# Patient Record
Sex: Male | Born: 1975
Health system: Southern US, Community
[De-identification: ages and names within clinical notes are randomized; demographics above are authoritative.]

## PROBLEM LIST (undated history)

## (undated) DIAGNOSIS — F32A Depression, unspecified: Secondary | ICD-10-CM

## (undated) DIAGNOSIS — F329 Major depressive disorder, single episode, unspecified: Secondary | ICD-10-CM

## (undated) DIAGNOSIS — B1920 Unspecified viral hepatitis C without hepatic coma: Secondary | ICD-10-CM

## (undated) DIAGNOSIS — D649 Anemia, unspecified: Secondary | ICD-10-CM

## (undated) DIAGNOSIS — F410 Panic disorder [episodic paroxysmal anxiety] without agoraphobia: Secondary | ICD-10-CM

## (undated) DIAGNOSIS — R7989 Other specified abnormal findings of blood chemistry: Secondary | ICD-10-CM

## (undated) DIAGNOSIS — F1910 Other psychoactive substance abuse, uncomplicated: Secondary | ICD-10-CM

## (undated) DIAGNOSIS — G709 Myoneural disorder, unspecified: Secondary | ICD-10-CM

## (undated) DIAGNOSIS — G039 Meningitis, unspecified: Secondary | ICD-10-CM

## (undated) DIAGNOSIS — K7460 Unspecified cirrhosis of liver: Secondary | ICD-10-CM

## (undated) DIAGNOSIS — F419 Anxiety disorder, unspecified: Secondary | ICD-10-CM

## (undated) HISTORY — DX: Other psychoactive substance abuse, uncomplicated: F19.10

## (undated) HISTORY — DX: Unspecified viral hepatitis C without hepatic coma: B19.20

## (undated) HISTORY — DX: Meningitis, unspecified: G03.9

## (undated) HISTORY — PX: ESOPHAGOGASTRODUODENOSCOPY: SHX1529

## (undated) HISTORY — DX: Depression, unspecified: F32.A

## (undated) HISTORY — DX: Unspecified cirrhosis of liver: K74.60

## (undated) HISTORY — DX: Anemia, unspecified: D64.9

## (undated) HISTORY — DX: Major depressive disorder, single episode, unspecified: F32.9

## (undated) HISTORY — DX: Myoneural disorder, unspecified: G70.9

## (undated) HISTORY — PX: WISDOM TOOTH EXTRACTION: SHX21

## (undated) HISTORY — DX: Other specified abnormal findings of blood chemistry: R79.89

## (undated) HISTORY — PX: COLONOSCOPY: SHX174

## (undated) HISTORY — PX: OTHER SURGICAL HISTORY: SHX169

---

## 2008-11-11 ENCOUNTER — Emergency Department (HOSPITAL_COMMUNITY): Admission: EM | Admit: 2008-11-11 | Discharge: 2008-11-11 | Payer: Self-pay | Admitting: Emergency Medicine

## 2009-05-03 ENCOUNTER — Emergency Department (HOSPITAL_COMMUNITY): Admission: EM | Admit: 2009-05-03 | Discharge: 2009-05-03 | Payer: Self-pay | Admitting: Emergency Medicine

## 2009-06-23 ENCOUNTER — Emergency Department (HOSPITAL_COMMUNITY): Admission: EM | Admit: 2009-06-23 | Discharge: 2009-06-23 | Payer: Self-pay | Admitting: Emergency Medicine

## 2011-03-11 LAB — URINALYSIS, ROUTINE W REFLEX MICROSCOPIC
Glucose, UA: NEGATIVE mg/dL
Hgb urine dipstick: NEGATIVE
Specific Gravity, Urine: 1.021 (ref 1.005–1.030)
pH: 7.5 (ref 5.0–8.0)

## 2015-03-05 ENCOUNTER — Encounter (HOSPITAL_COMMUNITY): Payer: Self-pay | Admitting: *Deleted

## 2015-03-05 ENCOUNTER — Emergency Department (INDEPENDENT_AMBULATORY_CARE_PROVIDER_SITE_OTHER)
Admission: EM | Admit: 2015-03-05 | Discharge: 2015-03-05 | Disposition: A | Discharge status: Home / Self Care | Payer: PRIVATE HEALTH INSURANCE | Source: Home / Self Care | Attending: Family Medicine | Admitting: Family Medicine

## 2015-03-05 DIAGNOSIS — F410 Panic disorder [episodic paroxysmal anxiety] without agoraphobia: Secondary | ICD-10-CM

## 2015-03-05 HISTORY — DX: Panic disorder (episodic paroxysmal anxiety): F41.0

## 2015-03-05 HISTORY — DX: Anxiety disorder, unspecified: F41.9

## 2015-03-05 MED ORDER — SERTRALINE HCL 50 MG PO TABS: 50.0000 mg | ORAL_TABLET | Freq: Every day | ORAL | Status: DC

## 2015-03-05 MED ORDER — CLONAZEPAM 0.5 MG PO TABS: 0.5000 mg | ORAL_TABLET | Freq: Two times a day (BID) | ORAL | Status: DC

## 2015-03-05 NOTE — ED Provider Notes (Signed)
Kenneth Brooks is a 39 y.o. male who presents to Urgent Care today for panic attacks. Patient has a history of panic disorder and has been doing reasonably well off of medicine. However recently multiple life stressors and happened. He has had multiple deaths in the family and is currently Getting divorced. He does not have a primary care provider in the area. He's done well with Klonopin in the past. He had a panic attack last week and yesterday. These lasted about 10 minutes and were controlled with breathing.   Past Medical History  Diagnosis Date  . Anxiety   . Panic attack    History reviewed. No pertinent past surgical history. History  Substance Use Topics  . Smoking status: Current Every Day Smoker    Types: Cigarettes  . Smokeless tobacco: Not on file  . Alcohol Use: No   ROS as above Medications: No current facility-administered medications for this encounter.   Current Outpatient Prescriptions  Medication Sig Dispense Refill  . aspirin 325 MG tablet Take 325 mg by mouth daily.    . clonazePAM (KLONOPIN) 0.5 MG tablet Take 1 tablet (0.5 mg total) by mouth 2 (two) times daily. 40 tablet 0  . sertraline (ZOLOFT) 50 MG tablet Take 1 tablet (50 mg total) by mouth daily. 30 tablet 0   Allergies  Allergen Reactions  . Bee Venom Shortness Of Breath     Exam:  BP 143/78 mmHg  Pulse 80  Temp(Src) 98.8 F (37.1 C) (Oral)  Resp 16  SpO2 96% Gen: Well NAD Psych: Alert and oriented normal affect and speech thought process. No SI or HI.  No results found for this or any previous visit (from the past 24 hour(s)). No results found.  Assessment and Plan: 39 y.o. male with panic disorder. Start sertraline. Limited Klonopin. Will not refill this medication here. Long list of primary care providers provided.  Discussed warning signs or symptoms. Please see discharge instructions. Patient expresses understanding.     Rodolph BongEvan S Cathie Bonnell, MD 03/05/15 1640

## 2015-03-05 NOTE — ED Notes (Signed)
Pt reports a hx of panic attacks the past 14 years.  He had one a week ago and yesterday   He has had  a lot of family issues to deal with lately.  At present, he feels some anxiety and is trying to do deep breathing to calm.

## 2015-03-05 NOTE — Discharge Instructions (Signed)
Thank you for coming in today. Follow up with primary doctor.   Panic Attacks Panic attacks are sudden, short-livedsurges of severe anxiety, fear, or discomfort. They may occur for no reason when you are relaxed, when you are anxious, or when you are sleeping. Panic attacks may occur for a number of reasons:   Healthy people occasionally have panic attacks in extreme, life-threatening situations, such as war or natural disasters. Normal anxiety is a protective mechanism of the body that helps [place] react to danger (fight or flight response).  Panic attacks are often seen with anxiety disorders, such as panic disorder, social anxiety disorder, generalized anxiety disorder, and phobias. Anxiety disorders cause excessive or uncontrollable anxiety. They may interfere with your relationships or other life activities.  Panic attacks are sometimes seen with other mental illnesses, such as depression and posttraumatic stress disorder.  Certain medical conditions, prescription medicines, and drugs of abuse can cause panic attacks. SYMPTOMS  Panic attacks start suddenly, peak within 20 minutes, and are accompanied by four or more of the following symptoms:  Pounding heart or fast heart rate (palpitations).  Sweating.  Trembling or shaking.  Shortness of breath or feeling smothered.  Feeling choked.  Chest pain or discomfort.  Nausea or strange feeling in your stomach.  Dizziness, light-headedness, or feeling like you will faint.  Chills or hot flushes.  Numbness or tingling in your lips or hands and feet.  Feeling that things are not real or feeling that you are not yourself.  Fear of losing control or going crazy.  Fear of dying. Some of these symptoms can mimic serious medical conditions. For example, you may think you are having a heart attack. Although panic attacks can be very scary, they are not life threatening. DIAGNOSIS  Panic attacks are diagnosed through an assessment by  your health care provider. Your health care provider will ask questions about your symptoms, such as where and when they occurred. Your health care provider will also ask about your medical history and use of alcohol and drugs, including prescription medicines. Your health care provider may order blood tests or other studies to rule out a serious medical condition. Your health care provider may refer you to a mental health professional for further evaluation. TREATMENT   Most healthy people who have one or two panic attacks in an extreme, life-threatening situation will not require treatment.  The treatment for panic attacks associated with anxiety disorders or other mental illness typically involves counseling with a mental health professional, medicine, or a combination of both. Your health care provider will help determine what treatment is best for you.  Panic attacks due to physical illness usually go away with treatment of the illness. If prescription medicine is causing panic attacks, talk with your health care provider about stopping the medicine, decreasing the dose, or substituting another medicine.  Panic attacks due to alcohol or drug abuse go away with abstinence. Some adults need professional help in order to stop drinking or using drugs. HOME CARE INSTRUCTIONS   Take all medicines as directed by your health care provider.   Schedule and attend follow-up visits as directed by your health care provider. It is important to keep all your appointments. SEEK MEDICAL CARE IF:  You are not able to take your medicines as prescribed.  Your symptoms do not improve or get worse. SEEK IMMEDIATE MEDICAL CARE IF:   You experience panic attack symptoms that are different than your usual symptoms.  You have serious thoughts about  hurting yourself or others.  You are taking medicine for panic attacks and have a serious side effect. MAKE SURE YOU:  Understand these instructions.  Will  watch your condition.  Will get help right away if you are not doing well or get worse. Document Released: 11/19/2005 Document Revised: 11/24/2013 Document Reviewed: 07/03/2013 [facility] Patient Information 2015 [place], [place]. This information is not intended to replace advice given to you by your health care provider. Make sure you discuss any questions you have with your health care provider.  PRIMARY CARE Merchant navy officerDOCTORS [facility] [address]  [place], [place] [phone]  Fax [phone]  Nature conservation officerLeBauer Health[facility] at [facility] [address]  [place], [place] [phone]  Fax [phone]  Nature conservation officer[facility] at [place] [facility] [place] [phone]  [place], [place] [phone]  Fax [phone]  [facility] [address]  [place], [place] [phone]  Fax [phone]  [facility] [address]  [place], [place] [phone]  Fax [phone]  [facility] [address] [place]  [place], [place] [phone]  Fax [phone]   [facility] [place] [address] [place] [address] Phone: [phone]   [facility] [address] [place] [address] Phone: [phone]   [facility] @ [place] [address] [place] [address] Phone: [phone]   [facility] @ [place] [address] [place] [address] Phone: [phone]   [facility] @ [place] [address] [place] [address] Phone: [phone] Fax: [phone]   [facility] @ [place] [address] [place] [address] Phone: [phone]   Dr. Maryelizabeth RowanElizabeth Dewey [address] [place]  [id number] [phone]

## 2015-05-03 ENCOUNTER — Encounter (HOSPITAL_COMMUNITY): Payer: Self-pay | Admitting: Family Medicine

## 2015-05-03 ENCOUNTER — Emergency Department (INDEPENDENT_AMBULATORY_CARE_PROVIDER_SITE_OTHER)
Admission: EM | Admit: 2015-05-03 | Discharge: 2015-05-03 | Disposition: A | Discharge status: Home / Self Care | Payer: PRIVATE HEALTH INSURANCE | Source: Home / Self Care | Attending: Family Medicine | Admitting: Family Medicine

## 2015-05-03 DIAGNOSIS — F419 Anxiety disorder, unspecified: Secondary | ICD-10-CM | POA: Diagnosis not present

## 2015-05-03 DIAGNOSIS — Z720 Tobacco use: Secondary | ICD-10-CM

## 2015-05-03 MED ORDER — SERTRALINE HCL 50 MG PO TABS: 50.0000 mg | ORAL_TABLET | Freq: Every day | ORAL | Status: DC

## 2015-05-03 MED ORDER — CLONAZEPAM 1 MG PO TABS: 1.0000 mg | ORAL_TABLET | Freq: Two times a day (BID) | ORAL | Status: DC

## 2015-05-03 NOTE — ED Notes (Signed)
See provider's note

## 2015-05-03 NOTE — ED Provider Notes (Signed)
CSN: [id number]     Arrival date & time 05/03/15  1627 History   First MD Initiated Contact with Patient 05/03/15 1730     No chief complaint on file.  (Consider location/radiation/quality/duration/timing/severity/associated sxs/prior Treatment) HPI  Feeling Anxious. Ran out of Klonopin and Zoloft today. Travels around the country for work and does not have a PCP. In [place] for 5 days. Denies on palpitations, shortness of breath, to nausea, vomiting, headache, fever, abdominal pain. Symptoms are intermittent and getting worse.    Past Medical History  Diagnosis Date  . Anxiety   . Panic attack    History reviewed. No pertinent past surgical history. Family History  Problem Relation Age of Onset  . Family history unknown: Yes   History  Substance Use Topics  . Smoking status: Current Every Day Smoker -- 0.50 packs/day    Types: Cigarettes  . Smokeless tobacco: Not on file  . Alcohol Use: No    Review of Systems Per HPI with all other pertinent systems negative.   Allergies  Bee venom  Home Medications   Prior to Admission medications   Medication Sig Start Date End Date Taking? Authorizing Provider  aspirin 325 MG tablet Take 325 mg by mouth daily.    Historical Provider, MD  clonazePAM (KLONOPIN) 1 MG tablet Take 1 tablet (1 mg total) by mouth 2 (two) times daily. 05/03/15   Ozella Rocksavid J Merrell, MD  sertraline (ZOLOFT) 50 MG tablet Take 1 tablet (50 mg total) by mouth daily. 05/03/15   Ozella Rocksavid J Merrell, MD   BP 127/76 mmHg  Pulse 93  Temp(Src) 99.4 F (37.4 C) (Oral)  Resp 18  SpO2 100% Physical Exam Physical Exam  Constitutional: oriented to person, place, and time. appears well-developed and well-nourished. No distress.  HENT:  Head: Normocephalic and atraumatic.  Eyes: EOMI. PERRL.  Neck: Normal range of motion.  Cardiovascular: RRR, no m/r/g, 2+ distal pulses,  Pulmonary/Chest: Effort normal and breath sounds normal. No respiratory distress.  Abdominal: Soft. Bowel  sounds are normal. NonTTP, no distension.  Musculoskeletal: Normal range of motion. Non ttp, no effusion.  Neurological: alert and oriented to person, place, and time.  Skin: Skin is warm. No rash noted. non diaphoretic.  Psychiatric: normal mood and affect. behavior is normal. Judgment and thought content normal.   ED Course  Procedures (including critical care time) Labs Review Labs Reviewed - No data to display  Imaging Review No results found.   MDM   1. Anxiety   2. Tobacco use    Call patient's pharmacy confirmed the patient received 60 tablets of Klonopin on 04/12/2015. Refill of 1mg  Klonopin given dated 05/13/15. Pt aware that he cannot get this filled until that time. Pt aware and will try some of his relaxation techniques until that time or will seek further care throught the Ed. Informed that no further refills will be given through the UC. Pt to seek a PCP for further mgt. Zoloft refilled  tobacco cessation counseling given.   Ozella Rocksavid J Merrell, MD 05/03/15 [phone]

## 2015-05-03 NOTE — Discharge Instructions (Signed)
You are using your Klonopin too rapidly. This is dangerous. Please establish care at a local clinic for further refills. We cannot fill your Klonopin until 05/13/15, and will not be able to give you further refills on this medicine. Please continue your zoloft.

## 2016-02-10 ENCOUNTER — Encounter (HOSPITAL_COMMUNITY): Payer: Self-pay | Admitting: Vascular Surgery

## 2016-02-10 ENCOUNTER — Emergency Department (HOSPITAL_COMMUNITY)
Admission: EM | Admit: 2016-02-10 | Discharge: 2016-02-10 | Disposition: A | Discharge status: Home / Self Care | Payer: PRIVATE HEALTH INSURANCE | Attending: Emergency Medicine | Admitting: Emergency Medicine

## 2016-02-10 DIAGNOSIS — Z7982 Long term (current) use of aspirin: Secondary | ICD-10-CM | POA: Insufficient documentation

## 2016-02-10 DIAGNOSIS — M5442 Lumbago with sciatica, left side: Secondary | ICD-10-CM | POA: Insufficient documentation

## 2016-02-10 DIAGNOSIS — F17210 Nicotine dependence, cigarettes, uncomplicated: Secondary | ICD-10-CM | POA: Insufficient documentation

## 2016-02-10 DIAGNOSIS — F419 Anxiety disorder, unspecified: Secondary | ICD-10-CM | POA: Insufficient documentation

## 2016-02-10 DIAGNOSIS — Z79899 Other long term (current) drug therapy: Secondary | ICD-10-CM | POA: Insufficient documentation

## 2016-02-10 MED ORDER — IBUPROFEN 400 MG PO TABS
800.0000 mg | ORAL_TABLET | Freq: Once | ORAL | Status: AC
  Administered 2016-02-10: 800 mg via ORAL
  Filled 2016-02-10: qty 2

## 2016-02-10 MED ORDER — METHOCARBAMOL 500 MG PO TABS: 500.0000 mg | ORAL_TABLET | Freq: Two times a day (BID) | ORAL | Status: DC

## 2016-02-10 MED ORDER — METHOCARBAMOL 500 MG PO TABS
1000.0000 mg | ORAL_TABLET | Freq: Once | ORAL | Status: AC
  Administered 2016-02-10: 1000 mg via ORAL
  Filled 2016-02-10: qty 2

## 2016-02-10 MED ORDER — IBUPROFEN 600 MG PO TABS: 600.0000 mg | ORAL_TABLET | Freq: Four times a day (QID) | ORAL | Status: DC | PRN

## 2016-02-10 NOTE — Discharge Instructions (Signed)
[name],  Nice meeting you! Please follow-up with your primary care provider within two weeks. Return to the emergency department if you develop fevers, chills, weight loss, loss of bladder/bowel control. Feel better soon!  S. Lane HackerNicole Marqual Mi, PA-C Chronic Back Pain  When back pain lasts longer than 3 months, it is called chronic back pain.People with chronic back pain often go through certain periods that are more intense (flare-ups).  CAUSES Chronic back pain can be caused by wear and tear (degeneration) on different structures in your back. These structures include:  The bones of your spine (vertebrae) and the joints surrounding your spinal cord and nerve roots (facets).  The strong, fibrous tissues that connect your vertebrae (ligaments). Degeneration of these structures may result in pressure on your nerves. This can lead to constant pain. HOME CARE INSTRUCTIONS  Avoid bending, heavy lifting, prolonged sitting, and activities which make the problem worse.  Take brief periods of rest throughout the day to reduce your pain. Lying down or standing usually is better than sitting while you are resting.  Take over-the-counter or prescription medicines only as directed by your caregiver. SEEK IMMEDIATE MEDICAL CARE IF:   You have weakness or numbness in one of your legs or feet.  You have trouble controlling your bladder or bowels.  You have nausea, vomiting, abdominal pain, shortness of breath, or fainting.   This information is not intended to replace advice given to you by your health care provider. Make sure you discuss any questions you have with your health care provider.   Document Released: 12/27/2004 Document Revised: 02/11/2012 Document Reviewed: 05/09/2015 Elsevier Interactive Patient Education [employer]2016 Elsevier Inc.

## 2016-02-10 NOTE — ED Notes (Signed)
Pt is here mainly for pain in low back, left side.  Pain goes down his buttock.  Denies injury.   Also wants US on what a friend of his told him was probably a cystic lymph node in his neck.  Is concerned he has sinus infection.

## 2016-02-10 NOTE — ED Provider Notes (Signed)
CSN: [id number]     Arrival date & time 02/10/16  1340 History  By signing my name below, I, Placido Sou, attest that this documentation has been prepared under the direction and in the presence of Melton Krebs PA-C. Electronically Signed: Placido Sou, ED Scribe. 02/10/2016. 6:08 PM.   Chief Complaint  Patient presents with  . Back Pain   The history is provided by the patient. No language interpreter was used.   HPI Comments: Kenneth Brooks is a 40 y.o. male who presents to the Emergency Department complaining of intermittent, moderate, low-central back pain onset 1 year ago and worsening a few days ago. Pt reports injuring his back at work 1 year ago which alleviated after 3-4 weeks noting that his pain presented once again and is similar to his past episode. He reports a radiation of pain downwards through his left gluteal region and worsening pain with movement. He also complains of a "cystic lymph node" to his right anterior throat onset in the past few weeks. Pt denies any throat pain, Trouble breathing, drooling, dysphagia, incontinence of his bowels or bladder or other associated symptoms at this time.   Past Medical History  Diagnosis Date  . Anxiety   . Panic attack    History reviewed. No pertinent past surgical history. Family History  Problem Relation Age of Onset  . Family history unknown: Yes   Social History  Substance Use Topics  . Smoking status: Current Every Day Smoker -- 0.50 packs/day    Types: Cigarettes  . Smokeless tobacco: None  . Alcohol Use: No    Review of Systems A complete 10 system review of systems was obtained and all systems are negative except as noted in the HPI and PMH.   Allergies  Bee venom  Home Medications   Prior to Admission medications   Medication Sig Start Date End Date Taking? Authorizing Provider  aspirin 325 MG tablet Take 325 mg by mouth daily.    Historical Provider, MD  clonazePAM (KLONOPIN) 1 MG tablet  Take 1 tablet (1 mg total) by mouth 2 (two) times daily. 05/03/15   Ozella Rocks, MD  sertraline (ZOLOFT) 50 MG tablet Take 1 tablet (50 mg total) by mouth daily. 05/03/15   Ozella Rocks, MD   BP 131/80 mmHg  Pulse 71  Temp(Src) 98.6 F (37 C) (Oral)  Resp 18  Wt 215 lb (97.523 kg)  SpO2 97%    Physical Exam  Constitutional: He is oriented to person, place, and time. He appears well-developed and well-nourished.  HENT:  Head: Normocephalic and atraumatic.  Mouth/Throat: Oropharynx is clear and moist. No oropharyngeal exudate.  Airway intact.  Eyes: EOM are normal.  Neck: Normal range of motion. Neck supple.  No masses felt around neck.   Cardiovascular: Normal rate.   Pulmonary/Chest: Effort normal. No stridor. No respiratory distress.  Abdominal: Soft.  Musculoskeletal: Normal range of motion. He exhibits tenderness.  No CTL midline spinal tenderness. Tenderness over left SI joint.   Lymphadenopathy:    He has no cervical adenopathy.  Neurological: He is alert and oriented to person, place, and time.  Skin: Skin is warm and dry.  Psychiatric: He has a normal mood and affect.  Nursing note and vitals reviewed.   ED Course  Procedures  DIAGNOSTIC STUDIES: Oxygen Saturation is 97% on RA, normal by my interpretation.    COORDINATION OF CARE: 6:04 PM Discussed next steps with pt including robaxin and motrin. He verbalized understanding  and is agreeable with the plan.    MDM   Final diagnoses:  Low back pain with left-sided sciatica, unspecified back pain laterality   Patient with back pain.  No neurological deficits and normal neuro exam.  Patient can walk but states is painful.  No loss of bowel or bladder control.  No concern for cauda equina.  No fever, night sweats, weight loss, h/o cancer, IVDU.  RICE protocol and pain medicine indicated and discussed with patient.  Patient sent home with Robaxin and ibuprofen, as well as encouragement to follow-up with primary  care provider for his "cystic lymph node" as well as his chronic back pain. Patient is in understanding and agreement with the plan. Patient may be safely discharged at this time.  Melton KrebsSamantha Nicole Kaizer Dissinger, PA-C 02/10/16 2340  Glynn OctaveStephen Rancour, MD 02/10/16 [phone]

## 2016-02-10 NOTE — ED Notes (Signed)
Pt reports to the ED for eval of lower back pain. Got injured last year at work. Pt denies any numbness, tingling, paralysis, or bowel or bladder incontinence. Pt also reports possible sinus infection and sore throat. Pt requesting u/s for throat because he feels like something is stuck in it. Pt A&Ox4, resp e/u, and skin warm and dry.

## 2016-08-08 ENCOUNTER — Emergency Department (HOSPITAL_COMMUNITY)
Admission: EM | Admit: 2016-08-08 | Discharge: 2016-08-08 | Disposition: A | Discharge status: Home / Self Care | Payer: PRIVATE HEALTH INSURANCE | Attending: Physician Assistant | Admitting: Physician Assistant

## 2016-08-08 ENCOUNTER — Encounter (HOSPITAL_COMMUNITY): Payer: Self-pay | Admitting: Emergency Medicine

## 2016-08-08 DIAGNOSIS — K0889 Other specified disorders of teeth and supporting structures: Secondary | ICD-10-CM

## 2016-08-08 DIAGNOSIS — Z79899 Other long term (current) drug therapy: Secondary | ICD-10-CM | POA: Insufficient documentation

## 2016-08-08 DIAGNOSIS — F17210 Nicotine dependence, cigarettes, uncomplicated: Secondary | ICD-10-CM | POA: Insufficient documentation

## 2016-08-08 DIAGNOSIS — Z7982 Long term (current) use of aspirin: Secondary | ICD-10-CM | POA: Insufficient documentation

## 2016-08-08 MED ORDER — PENICILLIN V POTASSIUM 250 MG PO TABS: 250.0000 mg | ORAL_TABLET | Freq: Four times a day (QID) | ORAL | 0 refills | Status: AC

## 2016-08-08 NOTE — ED Triage Notes (Signed)
Pt c/o pain in back right tooth, states his insurance hasns't kicked it, requesting antibiotics to take before he sees a Education officer, communitydentist.

## 2016-08-08 NOTE — ED Provider Notes (Signed)
[facility]EMERGENCY DEPT Provider Note   CSN: [id number] Arrival date & time: 08/08/16  1357    By signing my name below, I, Sonum Patel, attest that this documentation has been prepared under the direction and in the presence of Teressa LowerVrinda Correne Lalani, NP. Electronically Signed: Sonum Patel, Neurosurgeoncribe. 08/08/16. 2:20 PM.  History   Chief Complaint Chief Complaint  Patient presents with  . Dental Pain    The history is provided by the patient. No language interpreter was used.     HPI Comments: Kenneth Brooks is a 40 y.o. male who presents to the Emergency Department complaining of constant, gradually worsened right lower dental pain that has worsened over the last several days. He denies dental follow up; states he is waiting for his insurance to become active. He fracture the tooth a long time ago. Denies problems swallowing or breathing.   Past Medical History:  Diagnosis Date  . Anxiety   . Panic attack     There are no active problems to display for this patient.   History reviewed. No pertinent surgical history.     Home Medications    Prior to Admission medications   Medication Sig Start Date End Date Taking? Authorizing Provider  aspirin 325 MG tablet Take 325 mg by mouth daily.    Historical Provider, MD  clonazePAM (KLONOPIN) 1 MG tablet Take 1 tablet (1 mg total) by mouth 2 (two) times daily. 05/03/15   Ozella Rocksavid J Merrell, MD  ibuprofen (ADVIL,MOTRIN) 600 MG tablet Take 1 tablet (600 mg total) by mouth every 6 (six) hours as needed. 02/10/16   Melton KrebsSamantha Nicole Riley, PA-C  methocarbamol (ROBAXIN) 500 MG tablet Take 1 tablet (500 mg total) by mouth 2 (two) times daily. 02/10/16   Melton KrebsSamantha Nicole Riley, PA-C  sertraline (ZOLOFT) 50 MG tablet Take 1 tablet (50 mg total) by mouth daily. 05/03/15   Ozella Rocksavid J Merrell, MD    Family History Family History  Problem Relation Age of Onset  . Family history unknown: Yes    Social History Social History  Substance Use Topics  . Smoking  status: Current Every Day Smoker    Packs/day: 0.50    Types: Cigarettes  . Smokeless tobacco: Not on file  . Alcohol use No     Allergies   Bee venom   Review of Systems Review of Systems  Constitutional: Negative for fever.  HENT: Positive for dental problem.   All other systems reviewed and are negative.    Physical Exam Updated Vital Signs BP 150/91   Pulse 90   Temp 97.5 F (36.4 C) (Oral)   Resp 18   SpO2 98%   Physical Exam  Constitutional: He is oriented to person, place, and time. He appears well-developed and well-nourished. No distress.  HENT:  Head: Normocephalic and atraumatic.  Right Ear: External ear normal.  Left Ear: External ear normal.  Decayed cracked tooth to the right lower dentition. No facial swelling noted  Eyes: Conjunctivae and EOM are normal.  Neck: Neck supple. No tracheal deviation present.  Cardiovascular: Normal rate.   Pulmonary/Chest: Effort normal. No respiratory distress.  Musculoskeletal: Normal range of motion.  Neurological: He is alert and oriented to person, place, and time.  Skin: Skin is warm and dry.  Psychiatric: He has a normal mood and affect. His behavior is normal.  Nursing note and vitals reviewed.    ED Treatments / Results  DIAGNOSTIC STUDIES: Oxygen Saturation is 98% on RA, normal by my interpretation.  COORDINATION OF CARE: 2:24 PM Discussed treatment plan with pt at bedside and pt agreed to plan.   Labs (all labs ordered are listed, but only abnormal results are displayed) Labs Reviewed - No data to display  EKG  EKG Interpretation None       Radiology No results found.  Procedures Procedures (including critical care time)  Medications Ordered in ED Medications - No data to display   Initial Impression / Assessment and Plan / ED Course  I have reviewed the triage vital signs and the nursing notes.  Pertinent labs & imaging results that were available during my care of the patient  were reviewed by me and considered in my medical decision making (see chart for details).  Clinical Course   Will treat with pcn. Discussed dental follow up. No sign of ludwigs angina  Final Clinical Impressions(s) / ED Diagnoses   Final diagnoses:  None    New Prescriptions New Prescriptions   No medications on file   I personally performed the services described in this documentation, which was scribed in my presence. The recorded information has been reviewed and is accurate.    Teressa Lower, NP 08/08/16 1436    Courteney Randall An, MD 08/10/16 1101

## 2017-05-31 ENCOUNTER — Ambulatory Visit (HOSPITAL_COMMUNITY)
Admission: EM | Admit: 2017-05-31 | Discharge: 2017-05-31 | Disposition: A | Discharge status: Home / Self Care | Payer: BLUE CROSS/BLUE SHIELD | Attending: Internal Medicine | Admitting: Internal Medicine

## 2017-05-31 ENCOUNTER — Encounter (HOSPITAL_COMMUNITY): Payer: Self-pay | Admitting: Emergency Medicine

## 2017-05-31 DIAGNOSIS — B369 Superficial mycosis, unspecified: Secondary | ICD-10-CM

## 2017-05-31 DIAGNOSIS — M5432 Sciatica, left side: Secondary | ICD-10-CM

## 2017-05-31 DIAGNOSIS — L03114 Cellulitis of left upper limb: Secondary | ICD-10-CM | POA: Diagnosis not present

## 2017-05-31 MED ORDER — SULFAMETHOXAZOLE-TRIMETHOPRIM 800-160 MG PO TABS: 1.0000 | ORAL_TABLET | Freq: Two times a day (BID) | ORAL | 0 refills | Status: AC

## 2017-05-31 MED ORDER — FLUCONAZOLE 200 MG PO TABS: 200.0000 mg | ORAL_TABLET | Freq: Every day | ORAL | 0 refills | Status: DC

## 2017-05-31 MED ORDER — GABAPENTIN 300 MG PO CAPS: 300.0000 mg | ORAL_CAPSULE | Freq: Three times a day (TID) | ORAL | 0 refills | Status: DC

## 2017-05-31 MED ORDER — FLUCONAZOLE 200 MG PO TABS: 200.0000 mg | ORAL_TABLET | Freq: Every day | ORAL | 0 refills | Status: AC

## 2017-05-31 MED ORDER — SULFAMETHOXAZOLE-TRIMETHOPRIM 800-160 MG PO TABS: 1.0000 | ORAL_TABLET | Freq: Two times a day (BID) | ORAL | 0 refills | Status: DC

## 2017-05-31 NOTE — Discharge Instructions (Signed)
For your cellulitis (skin infection) take bactrim, 1 tablet twice a day for 7 days. Apply warm compress 4 or more times a day. If abscess forms or expands, return to clinic for drainage.  For rash, most likely fungal, I have started you on diflucan, take 1 tablet daily for 1 week. If it persists past two weeks, return to clinic as needed.  Finally, for sciatica, I have refilled you gabapentin, take 1 tablet three times a day, take three days to get to the full dose, ( 1 tablet at bed time tonight, 1 tablet twice a day tomorrow, on the third day 1 tablet 3 times a day) Follow up with primary care for further management of this condition.

## 2017-05-31 NOTE — ED Triage Notes (Signed)
Pt here for abscess on left arm onset 1.5 week  Sx include swelling, redness  Denies fevers, chills  Also c/o rash on legs and feet that come and go.   A&O x4... NAD... Ambulatory

## 2017-05-31 NOTE — ED Provider Notes (Signed)
CSN: [id number]     Arrival date & time 05/31/17  1432 History   First MD Initiated Contact with Patient 05/31/17 1456     Chief Complaint  Patient presents with  . Abscess   (Consider location/radiation/quality/duration/timing/severity/associated sxs/prior Treatment) Elijah BirkJames W Harkins is a 41 y.o. male with a past history of anxiety, who [facility] with a chief complaint of rash on his left forearm, a rash on his right foot, and lower back pain.  With regard to his lower back pain, he has a past diagnosis of sciatica, states he has been treated before with gabapentin successfully, he has no new acute changes in his pain. States he does not currently have primary care, however he recently got insurance, and is looking to establish with one. Has no loss of control of bowel or bladder function, no fever, chills, or other concerning symptoms for back pain.  With regard to the rash and the forearm, states he had a scrape to his skin last week, his progressively gotten redder, more painful, not draining pus, no red streaking up the arms.  With regard to the rash on his foot, is worse in the heat, when he is sweating, he has had similar rashes in the past, he is not painful, but states it does burn.       Past Medical History:  Diagnosis Date  . Anxiety   . Panic attack    History reviewed. No pertinent surgical history. Family History  Problem Relation Age of Onset  . Family history unknown: Yes   Social History  Substance Use Topics  . Smoking status: Current Every Day Smoker    Packs/day: 0.50    Types: Cigarettes  . Smokeless tobacco: Never Used  . Alcohol use No    Review of Systems  Constitutional: Negative.   HENT: Negative.   Cardiovascular: Negative.   Gastrointestinal: Negative.   Musculoskeletal: Positive for back pain.  Skin: Positive for rash.  Neurological: Negative.     Allergies  Bee venom  Home Medications   Prior to  Admission medications   Medication Sig Start Date End Date Taking? Authorizing Provider  lactobacillus acidophilus (BACID) TABS tablet Take 2 tablets by mouth 3 (three) times daily.   Yes [provider]  METHADONE HCL PO Take by mouth.   Yes [provider]  aspirin 325 MG tablet Take 325 mg by mouth daily.    [provider]  clonazePAM (KLONOPIN) 1 MG tablet Take 1 tablet (1 mg total) by mouth 2 (two) times daily. 05/03/15   Ozella RocksMerrell, David J, MD  fluconazole (DIFLUCAN) 200 MG tablet Take 1 tablet (200 mg total) by mouth daily. 05/31/17 06/07/17  Dorena BodoKennard, Tayna Smethurst, NP  gabapentin (NEURONTIN) 300 MG capsule Take 1 capsule (300 mg total) by mouth 3 (three) times daily. 05/31/17   Dorena BodoKennard, Kasee Hantz, NP  ibuprofen (ADVIL,MOTRIN) 600 MG tablet Take 1 tablet (600 mg total) by mouth every 6 (six) hours as needed. 02/10/16   Melton Krebsiley, Samantha Nicole, PA-C  methocarbamol (ROBAXIN) 500 MG tablet Take 1 tablet (500 mg total) by mouth 2 (two) times daily. 02/10/16   Melton Krebsiley, Samantha Nicole, PA-C  sertraline (ZOLOFT) 50 MG tablet Take 1 tablet (50 mg total) by mouth daily. 05/03/15   Ozella RocksMerrell, David J, MD  sulfamethoxazole-trimethoprim (BACTRIM DS,SEPTRA DS) 800-160 MG tablet Take 1 tablet by mouth 2 (two) times daily. 05/31/17 06/07/17  Dorena BodoKennard, Lashunta Frieden, NP   Meds Ordered and Administered this Visit  Medications - No data to display  BP (!) 142/80 (BP Location: Left Arm)   Pulse 85   Temp 98.1 F (36.7 C) (Oral)   Resp 16   SpO2 95%  No data found.   Physical Exam  Constitutional: He is oriented to person, place, and time. He appears well-developed and well-nourished. No distress.  HENT:  Head: Normocephalic.  Right Ear: External ear normal.  Left Ear: External ear normal.  Mouth/Throat: Oropharynx is clear and moist.  Eyes: Conjunctivae are normal.  Neck: Normal range of motion.  Cardiovascular: Normal rate and regular rhythm.   Pulmonary/Chest: Effort normal and breath sounds  normal.  Musculoskeletal: He exhibits tenderness.  Neurological: He is alert and oriented to person, place, and time.  Skin: Skin is warm. Capillary refill takes less than 2 seconds. Rash noted. He is not diaphoretic.  Proximately 2 x 2 centimeter area of redness and warmth, and swelling in the left forearm, consistent with cellulitis, does not appear to have consolidated into a firm abscess.  Dry, scaling, erythemic lesions to the right foot.  Psychiatric: He has a normal mood and affect. His behavior is normal.  Nursing note and vitals reviewed.   Urgent Care Course     Procedures (including critical care time)  Labs Review Labs Reviewed - No data to display  Imaging Review No results found.    MDM   1. Cellulitis of left upper extremity   2. Fungal skin infection   3. Sciatica of left side    Present diagnosis sciatica, patient given refill on gabapentin, and referral to community health and wellness to establish for primary care.  Cellulitis, started on Bactrim twice daily, encouraged warm compresses, return to clinic as needed.  With regard to the skin rash on the foot, most likely fungal, given short course of Diflucan, and advised to return to clinic in 2 weeks if persists     Dorena Bodo, NP 05/31/17 1605

## 2017-06-28 ENCOUNTER — Ambulatory Visit (INDEPENDENT_AMBULATORY_CARE_PROVIDER_SITE_OTHER): Payer: BLUE CROSS/BLUE SHIELD

## 2017-06-28 ENCOUNTER — Ambulatory Visit (INDEPENDENT_AMBULATORY_CARE_PROVIDER_SITE_OTHER): Payer: BLUE CROSS/BLUE SHIELD | Admitting: Physician Assistant

## 2017-06-28 ENCOUNTER — Encounter: Payer: Self-pay | Admitting: Physician Assistant

## 2017-06-28 VITALS — BP 137/88 | HR 77 | Temp 98.3°F | Resp 16 | Ht 72.0 in | Wt 263.0 lb

## 2017-06-28 DIAGNOSIS — G8929 Other chronic pain: Secondary | ICD-10-CM | POA: Diagnosis not present

## 2017-06-28 DIAGNOSIS — R0600 Dyspnea, unspecified: Secondary | ICD-10-CM

## 2017-06-28 DIAGNOSIS — R05 Cough: Secondary | ICD-10-CM | POA: Diagnosis not present

## 2017-06-28 DIAGNOSIS — R233 Spontaneous ecchymoses: Secondary | ICD-10-CM

## 2017-06-28 DIAGNOSIS — F17210 Nicotine dependence, cigarettes, uncomplicated: Secondary | ICD-10-CM

## 2017-06-28 DIAGNOSIS — Z131 Encounter for screening for diabetes mellitus: Secondary | ICD-10-CM | POA: Diagnosis not present

## 2017-06-28 DIAGNOSIS — Z1159 Encounter for screening for other viral diseases: Secondary | ICD-10-CM

## 2017-06-28 DIAGNOSIS — M545 Low back pain: Secondary | ICD-10-CM | POA: Diagnosis not present

## 2017-06-28 DIAGNOSIS — M5442 Lumbago with sciatica, left side: Secondary | ICD-10-CM | POA: Diagnosis not present

## 2017-06-28 DIAGNOSIS — R0609 Other forms of dyspnea: Secondary | ICD-10-CM | POA: Diagnosis not present

## 2017-06-28 LAB — POCT URINALYSIS DIP (MANUAL ENTRY)
Glucose, UA: NEGATIVE mg/dL
Ketones, POC UA: NEGATIVE mg/dL
Leukocytes, UA: NEGATIVE
NITRITE UA: NEGATIVE
PH UA: 5.5 (ref 5.0–8.0)
RBC UA: NEGATIVE
Spec Grav, UA: >=1.03 — AB (ref 1.010–1.025)
UROBILINOGEN UA: 1 U/dL

## 2017-06-28 LAB — POCT CBC
Granulocyte percent: 48.1 %G (ref 37–80)
HEMATOCRIT: 46.2 % (ref 43.5–53.7)
Hemoglobin: 15.4 g/dL (ref 14.1–18.1)
Lymph, poc: 3.5 — AB (ref 0.6–3.4)
MCH, POC: 31.1 pg (ref 27–31.2)
MCHC: 33.2 g/dL (ref 31.8–35.4)
MCV: 93.5 fL (ref 80–97)
MID (cbc): 0.6 (ref 0–0.9)
MPV: 8.3 fL (ref 0–99.8)
POC GRANULOCYTE: 3.8 (ref 2–6.9)
POC LYMPH %: 43.9 % (ref 10–50)
POC MID %: 8 %M (ref 0–12)
Platelet Count, POC: 168 10*3/uL (ref 142–424)
RBC: 4.94 M/uL (ref 4.69–6.13)
RDW, POC: 13.8 %
WBC: 8 10*3/uL (ref 4.6–10.2)

## 2017-06-28 MED ORDER — GABAPENTIN 300 MG PO CAPS: 300.0000 mg | ORAL_CAPSULE | Freq: Three times a day (TID) | ORAL | 3 refills | Status: DC

## 2017-06-28 MED ORDER — VARENICLINE TARTRATE 1 MG PO TABS: 1.0000 mg | ORAL_TABLET | Freq: Two times a day (BID) | ORAL | 2 refills | Status: DC

## 2017-06-28 MED ORDER — VARENICLINE TARTRATE 0.5 MG X 11 & 1 MG X 42 PO MISC: ORAL | 0 refills | Status: DC

## 2017-06-28 NOTE — Patient Instructions (Addendum)
See you in three months.   We will call about referral to Hep C clinic.   Work on the smoking  Will call you about using Ibuprofen again.        IF you received an x-ray today, you will receive an invoice from [facility]. Please contact [facility] at [phone] with questions or concerns regarding your invoice.   IF you received labwork today, you will receive an invoice from [facility]. Please contact [facility] at [phone] with questions or concerns regarding your invoice.   Our billing staff will not be able to assist you with questions regarding bills from these companies.  You will be contacted with the lab results as soon as they are available. The fastest way to get your results is to activate your My Chart account. Instructions are located on the last page of this paperwork. If you have not heard from us regarding the results in 2 weeks, please contact this office.

## 2017-06-28 NOTE — Progress Notes (Signed)
07/01/2017 11:02 AM   DOB: 1976/08/08 / MRN: [id number]  SUBJECTIVE:  Kenneth Brooks is a 41 y.o. male presenting for he is a former drug user and has a known history of Hep C.  Takes methadone daily.  Tells me he has been clean now for 1.5 years. He is working 50 hours weekly. Tells me he would like this treated. Has been screened for HIV and tested negative.   He would like to quit smoking. Has tried cold [place]key and lozenges and these have failed. He has been smoking at least a pack daily for over a decade now.   Complains of a rash on his legs.  States that this may be here one day and gone the next. Has photos and they are included here. Tried abx from the [facility] and this did not really help.  Denies pain or itch about the rash.   Has a history of back pain with sciatica.  Tells me that he has pain in his right hamstrings and this is chronic.  No images exist [facility].   Tells me he is more short of breath than he used to but admits that he has gained almost 100 lbs since starting Methadone. Denies chest pain with this. Does have some cough.  Thinks this is his lungs.  This problem is not getting worse or better.    He is allergic to bee venom.   He  has a past medical history of Anxiety and Panic attack.    He  reports that he has been smoking Cigarettes.  He has been smoking about 0.50 packs per day. He has never used smokeless tobacco. He reports that he does not drink alcohol or use drugs. He  has no sexual activity history on file. The patient  has no past surgical history on file.  His Family history is unknown by patient.  Review of Systems  Constitutional: Negative for chills, diaphoresis and fever.  Respiratory: Positive for cough and shortness of breath. Negative for sputum production.   Cardiovascular: Negative for chest pain and leg swelling.  Gastrointestinal: Negative for nausea.  Skin: Negative for rash.  Neurological: Negative for dizziness and headaches.    The problem  list and medications were reviewed and updated by myself where necessary and exist elsewhere in the encounter.   OBJECTIVE:  BP 137/88 (BP Location: Right Arm, Patient Position: Sitting, Cuff Size: Large)   Pulse 77   Temp 98.3 F (36.8 C) (Oral)   Resp 16   Ht 6' (1.829 m)   Wt 263 lb (119.3 kg)   SpO2 94%   PF 600 L/min   BMI 35.67 kg/m   Physical Exam  Constitutional: He is oriented to person, place, and time. Vital signs are normal. He appears well-developed and well-nourished. He is active.  Non-toxic appearance. He has a sickly appearance. He does not appear ill. No distress.  Cardiovascular: Normal rate, regular rhythm, normal heart sounds and intact distal pulses.   Pulmonary/Chest: Effort normal and breath sounds normal. No respiratory distress. He has no wheezes. He has no rales. He exhibits no tenderness.  Neurological: He is alert and oriented to person, place, and time. He has normal reflexes. He displays normal reflexes. No cranial nerve deficit. He exhibits normal muscle tone. Coordination normal.  Skin: Skin is warm and dry. He is not diaphoretic. No pallor.       EKG:  NSR, early left axis, negative for hypertrophy, ischemia, infarction.  Results for orders placed or performed in visit on 06/28/17 (from the past 72 hour(s))  HIV antibody     Status: None   Collection Time: 06/28/17  5:13 PM  Result Value Ref Range   HIV Screen 4th Generation wRfx Non Reactive Non Reactive  HCV RNA quant     Status: None   Collection Time: 06/28/17  5:13 PM  Result Value Ref Range   Hepatitis C Quantitation 1,730,000 IU/mL   HCV log10 6.238 log10 IU/mL   Test Information Comment     Comment: The quantitative range of this assay is 15 IU/mL to 100 million IU/mL.  Lipid panel     Status: Abnormal   Collection Time: 06/28/17  5:13 PM  Result Value Ref Range   Cholesterol, Total 137 100 - 199 mg/dL   Triglycerides 161147 0 - 149 mg/dL   HDL 29 (L) >09>39 mg/dL   VLDL  Cholesterol Cal 29 5 - 40 mg/dL   LDL Calculated 79 0 - 99 mg/dL   Chol/HDL Ratio 4.7 0.0 - 5.0 ratio    Comment:                                   T. Chol/HDL Ratio                                             Men  Women                               1/2 Avg.Risk  3.4    3.3                                   Avg.Risk  5.0    4.4                                2X Avg.Risk  9.6    7.1                                3X Avg.Risk 23.4   11.0   Protime-INR     Status: Abnormal   Collection Time: 06/28/17  5:13 PM  Result Value Ref Range   INR 1.2 0.8 - 1.2    Comment: Reference interval is for non-anticoagulated patients. Suggested INR therapeutic range for Vitamin K antagonist therapy:    Standard Dose (moderate intensity                   therapeutic range):       2.0 - 3.0    Higher intensity therapeutic range       2.5 - 3.5    Prothrombin Time 12.2 (H) 9.1 - 12.0 sec  Hemoglobin A1c     Status: None   Collection Time: 06/28/17  5:13 PM  Result Value Ref Range   Hgb A1c MFr Bld 5.5 4.8 - 5.6 %    Comment:          Pre-diabetes: 5.7 - 6.4          Diabetes: >6.4  Glycemic control for adults with diabetes: <7.0    Est. average glucose Bld gHb Est-mCnc 111 mg/dL  POCT CBC     Status: Abnormal   Collection Time: 06/28/17  5:32 PM  Result Value Ref Range   WBC 8.0 4.6 - 10.2 K/uL   Lymph, poc 3.5 (A) 0.6 - 3.4   POC LYMPH PERCENT 43.9 10 - 50 %L   MID (cbc) 0.6 0 - 0.9   POC MID % 8.0 0 - 12 %M   POC Granulocyte 3.8 2 - 6.9   Granulocyte percent 48.1 37 - 80 %G   RBC 4.94 4.69 - 6.13 M/uL   Hemoglobin 15.4 14.1 - 18.1 g/dL   HCT, POC 16.1 09.6 - 53.7 %   MCV 93.5 80 - 97 fL   MCH, POC 31.1 27 - 31.2 pg   MCHC 33.2 31.8 - 35.4 g/dL   RDW, POC 04.5 %   Platelet Count, POC 168 142 - 424 K/uL   MPV 8.3 0 - 99.8 fL  POCT urinalysis dipstick     Status: Abnormal   Collection Time: 06/28/17  5:44 PM  Result Value Ref Range   Color, UA yellow yellow   Clarity, UA clear  clear   Glucose, UA negative negative mg/dL   Bilirubin, UA small (A) negative   Ketones, POC UA negative negative mg/dL   Spec Grav, UA >=4.098 (A) 1.010 - 1.025   Blood, UA negative negative   pH, UA 5.5 5.0 - 8.0   Protein Ur, POC trace (A) negative mg/dL   Urobilinogen, UA 1.0 0.2 or 1.0 E.U./dL   Nitrite, UA Negative Negative   Leukocytes, UA Negative Negative  Hepatitis B surface antigen     Status: None   Collection Time: 06/28/17  6:16 PM  Result Value Ref Range   Hepatitis B Surface Ag Negative Negative  Hepatitis B surface antibody     Status: Abnormal   Collection Time: 06/28/17  6:16 PM  Result Value Ref Range   Hepatitis B Surf Ab Quant <3.1 (L) Immunity>9.9 mIU/mL    Comment:   Status of Immunity                     Anti-HBs Level   ------------------                     -------------- Inconsistent with Immunity                   0.0 - 9.9 Consistent with Immunity                          >9.9     No results found.  ASSESSMENT AND PLAN:  Kenneth Brooks was seen today for establish care and rash.  Diagnoses and all orders for this visit:  Petechial rash -     POCT urinalysis dipstick -     POCT CBC -     HIV antibody -     HCV RNA quant -     Protime-INR  DOE (dyspnea on exertion) -     EKG 12-Lead -     DG Chest 2 View; Future  Cigarette smoker -     varenicline (CHANTIX STARTING MONTH PAK) 0.5 MG X 11 & 1 MG X 42 tablet; Take one 0.5 mg tablet by mouth once daily for 3 days, then increase to one 0.5 mg tablet twice daily for 4 days,  then increase to one 1 mg tablet twice daily. -     varenicline (CHANTIX CONTINUING MONTH PAK) 1 MG tablet; Take 1 tablet (1 mg total) by mouth 2 (two) times daily. -     Lipid panel  Chronic left-sided low back pain with left-sided sciatica -     DG Lumbar Spine 2-3 Views; Future -     gabapentin (NEURONTIN) 300 MG capsule; Take 1 capsule (300 mg total) by mouth 3 (three) times daily.  Screening for diabetes mellitus -      Hemoglobin A1c  Need for hepatitis B screening test -     Hepatitis B surface antigen -     Hepatitis B surface antibody  Other orders -     Cancel: CBC -     Hemoglobin A1c    The patient is advised to call or return to clinic if he does not see an improvement in symptoms, or to seek the care of the closest emergency department if he worsens with the above plan.   Deliah Boston, MHS, PA-C Primary Care at [facility] 07/01/2017 11:02 AM

## 2017-06-29 LAB — LIPID PANEL
Chol/HDL Ratio: 4.7 ratio (ref 0.0–5.0)
Cholesterol, Total: 137 mg/dL (ref 100–199)
HDL: 29 mg/dL — ABNORMAL LOW (ref >39)
LDL Calculated: 79 mg/dL (ref 0–99)
Triglycerides: 147 mg/dL (ref 0–149)
VLDL Cholesterol Cal: 29 mg/dL (ref 5–40)

## 2017-06-29 LAB — HIV ANTIBODY (ROUTINE TESTING W REFLEX): HIV SCREEN 4TH GENERATION: NONREACTIVE

## 2017-06-29 LAB — HEPATITIS B SURFACE ANTIBODY, QUANTITATIVE

## 2017-06-29 LAB — HCV RNA QUANT
HCV LOG10: 6.238 {Log_IU}/mL
HEPATITIS C QUANTITATION: 1730000 [IU]/mL

## 2017-06-29 LAB — HEPATITIS B SURFACE ANTIGEN: HEP B S AG: NEGATIVE

## 2017-06-29 LAB — HEMOGLOBIN A1C
Est. average glucose Bld gHb Est-mCnc: 111 mg/dL
HEMOGLOBIN A1C: 5.5 % (ref 4.8–5.6)

## 2017-06-29 LAB — PROTIME-INR
INR: 1.2 (ref 0.8–1.2)
PROTHROMBIN TIME: 12.2 s — AB (ref 9.1–12.0)

## 2017-07-01 ENCOUNTER — Other Ambulatory Visit: Payer: Self-pay | Admitting: Physician Assistant

## 2017-07-01 ENCOUNTER — Encounter: Payer: Self-pay | Admitting: Physician Assistant

## 2017-07-01 DIAGNOSIS — B182 Chronic viral hepatitis C: Secondary | ICD-10-CM | POA: Insufficient documentation

## 2017-07-01 MED ORDER — MELOXICAM 15 MG PO TABS: 7.5000 mg | ORAL_TABLET | Freq: Every day | ORAL | 0 refills | Status: DC

## 2017-07-01 NOTE — Progress Notes (Signed)
Hep C is causing some liver damage, however the function of his liver remains normal which is good.  I am going to get him into infectious disease so they can cure his liver. I am going to start him on meloxicam for his back pain. As far as the rash goes there is no findings to suggest a serious pathology at this point.  I suggest we get his hepatitis treated and try to reassure him with regard to the rash.  He should avoid aspirin, goodies, naprosyn, ibuprofen and stick to the meloxicam and gabapentin.

## 2017-07-02 LAB — COMPREHENSIVE METABOLIC PANEL
ALK PHOS: 194 IU/L — AB (ref 39–117)
ALT: 110 IU/L — AB (ref 0–44)
AST: 130 IU/L — ABNORMAL HIGH (ref 0–40)
Albumin/Globulin Ratio: 1.2 (ref 1.2–2.2)
Albumin: 4 g/dL (ref 3.5–5.5)
BILIRUBIN TOTAL: 0.6 mg/dL (ref 0.0–1.2)
BUN/Creatinine Ratio: 13 (ref 9–20)
BUN: 13 mg/dL (ref 6–20)
CHLORIDE: 104 mmol/L (ref 96–106)
CO2: 22 mmol/L (ref 20–29)
Calcium: 9.6 mg/dL (ref 8.7–10.2)
Creatinine, Ser: 1.04 mg/dL (ref 0.76–1.27)
GFR calc Af Amer: 104 mL/min/{1.73_m2} (ref >59)
GFR calc non Af Amer: 90 mL/min/{1.73_m2} (ref >59)
GLUCOSE: 71 mg/dL (ref 65–99)
Globulin, Total: 3.4 g/dL (ref 1.5–4.5)
Potassium: 4.2 mmol/L (ref 3.5–5.2)
Sodium: 142 mmol/L (ref 134–144)
Total Protein: 7.4 g/dL (ref 6.0–8.5)

## 2017-07-02 LAB — SPECIMEN STATUS REPORT

## 2017-07-11 ENCOUNTER — Emergency Department (HOSPITAL_COMMUNITY): Payer: BLUE CROSS/BLUE SHIELD

## 2017-07-11 ENCOUNTER — Emergency Department (HOSPITAL_COMMUNITY)
Admission: EM | Admit: 2017-07-11 | Discharge: 2017-07-11 | Disposition: A | Discharge status: Home / Self Care | Payer: BLUE CROSS/BLUE SHIELD | Attending: Emergency Medicine | Admitting: Emergency Medicine

## 2017-07-11 ENCOUNTER — Encounter (HOSPITAL_COMMUNITY): Payer: Self-pay | Admitting: Emergency Medicine

## 2017-07-11 DIAGNOSIS — F17210 Nicotine dependence, cigarettes, uncomplicated: Secondary | ICD-10-CM | POA: Insufficient documentation

## 2017-07-11 DIAGNOSIS — R109 Unspecified abdominal pain: Secondary | ICD-10-CM | POA: Diagnosis not present

## 2017-07-11 DIAGNOSIS — R1011 Right upper quadrant pain: Secondary | ICD-10-CM | POA: Diagnosis present

## 2017-07-11 DIAGNOSIS — N201 Calculus of ureter: Secondary | ICD-10-CM | POA: Diagnosis not present

## 2017-07-11 DIAGNOSIS — R0789 Other chest pain: Secondary | ICD-10-CM | POA: Diagnosis not present

## 2017-07-11 LAB — URINALYSIS, ROUTINE W REFLEX MICROSCOPIC
Bilirubin Urine: NEGATIVE
GLUCOSE, UA: NEGATIVE mg/dL
HGB URINE DIPSTICK: NEGATIVE
Ketones, ur: NEGATIVE mg/dL
Leukocytes, UA: NEGATIVE
Nitrite: NEGATIVE
PH: 6 (ref 5.0–8.0)
Protein, ur: NEGATIVE mg/dL
SPECIFIC GRAVITY, URINE: 1.014 (ref 1.005–1.030)

## 2017-07-11 LAB — COMPREHENSIVE METABOLIC PANEL
ALT: 78 U/L — ABNORMAL HIGH (ref 17–63)
AST: 86 U/L — ABNORMAL HIGH (ref 15–41)
Albumin: 3.5 g/dL (ref 3.5–5.0)
Alkaline Phosphatase: 169 U/L — ABNORMAL HIGH (ref 38–126)
Anion gap: 8 (ref 5–15)
BUN: 9 mg/dL (ref 6–20)
CHLORIDE: 103 mmol/L (ref 101–111)
CO2: 25 mmol/L (ref 22–32)
Calcium: 8.7 mg/dL — ABNORMAL LOW (ref 8.9–10.3)
Creatinine, Ser: 0.78 mg/dL (ref 0.61–1.24)
GFR calc non Af Amer: >60 mL/min (ref >60)
GLUCOSE: 110 mg/dL — AB (ref 65–99)
Potassium: 3.8 mmol/L (ref 3.5–5.1)
SODIUM: 136 mmol/L (ref 135–145)
Total Bilirubin: 0.8 mg/dL (ref 0.3–1.2)
Total Protein: 7.5 g/dL (ref 6.5–8.1)

## 2017-07-11 LAB — CBC
HEMATOCRIT: 46.2 % (ref 39.0–52.0)
HEMOGLOBIN: 15.9 g/dL (ref 13.0–17.0)
MCH: 32.1 pg (ref 26.0–34.0)
MCHC: 34.4 g/dL (ref 30.0–36.0)
MCV: 93.1 fL (ref 78.0–100.0)
Platelets: 162 10*3/uL (ref 150–400)
RBC: 4.96 MIL/uL (ref 4.22–5.81)
RDW: 13.5 % (ref 11.5–15.5)
WBC: 8.5 10*3/uL (ref 4.0–10.5)

## 2017-07-11 LAB — LIPASE, BLOOD: LIPASE: 29 U/L (ref 11–51)

## 2017-07-11 MED ORDER — METHOCARBAMOL 500 MG PO TABS: 500.0000 mg | ORAL_TABLET | Freq: Two times a day (BID) | ORAL | 0 refills | Status: DC

## 2017-07-11 NOTE — ED Provider Notes (Signed)
[facility]-EMERGENCY DEPT Provider Note   CSN: [id number] Arrival date & time: 07/11/17  0555     History   Chief Complaint Chief Complaint  Patient presents with  . Abdominal Pain    HPI Kenneth Brooks is a 41 y.o. male.  HPI  Kenneth Brooks is a 41 y.o. male with history of hepatitis C, presents to emergency department complaining of right lower chest/right upper abdominal pain. Patient states pain started yesterday. Pain is constant, sharp, does not radiate. He reports the pain is worse with movement. He has tried to apply ice to the area which has helped. He states the pain is also exacerbated with deep breathing. He does lift heavy things at work and wonders if he strained a muscle. He denies any nausea or vomiting. No problems with eating. Reports chronic constipation and took magnesium citrate yesterday and had a large bowel movement with no relief of his pain. Denies any flank pain or urinary symptoms. Denies cough or congestion. No shortness of breath. No rashes or lesions to the area. Patient is on methadone. No history of similar problems in the past.   Past Medical History:  Diagnosis Date  . Anxiety   . Panic attack     Patient Active Problem List   Diagnosis Date Noted  . Hepatitis C 07/01/2017    History reviewed. No pertinent surgical history.     Home Medications    Prior to Admission medications   Medication Sig Start Date End Date Taking? Authorizing Provider  gabapentin (NEURONTIN) 300 MG capsule Take 1 capsule (300 mg total) by mouth 3 (three) times daily. 06/28/17   Ofilia Neas, PA-C  lactobacillus acidophilus (BACID) TABS tablet Take 2 tablets by mouth 3 (three) times daily.    [provider]  meloxicam (MOBIC) 15 MG tablet Take 0.5-1 tablets (7.5-15 mg total) by mouth daily. Take with food. Do not take Ibuprofen, Goody's, or Aleve while taking this medication. 07/01/17 07/31/17  Ofilia Neas, PA-C  METHADONE HCL PO Take by mouth.     [provider]  varenicline (CHANTIX CONTINUING MONTH PAK) 1 MG tablet Take 1 tablet (1 mg total) by mouth 2 (two) times daily. 06/28/17   Ofilia Neas, PA-C  varenicline (CHANTIX STARTING MONTH PAK) 0.5 MG X 11 & 1 MG X 42 tablet Take one 0.5 mg tablet by mouth once daily for 3 days, then increase to one 0.5 mg tablet twice daily for 4 days, then increase to one 1 mg tablet twice daily. 06/28/17   Ofilia Neas, PA-C    Family History Family History  Problem Relation Age of Onset  . Family history unknown: Yes    Social History Social History  Substance Use Topics  . Smoking status: Current Every Day Smoker    Packs/day: 0.50    Types: Cigarettes  . Smokeless tobacco: Never Used  . Alcohol use No     Allergies   Bee venom   Review of Systems Review of Systems  Constitutional: Negative for chills and fever.  Respiratory: Negative for cough, chest tightness and shortness of breath.   Cardiovascular: Positive for chest pain. Negative for palpitations and leg swelling.  Gastrointestinal: Positive for abdominal pain. Negative for abdominal distention, diarrhea, nausea and vomiting.  Genitourinary: Negative for dysuria, frequency, hematuria and urgency.  Musculoskeletal: Negative for arthralgias, myalgias, neck pain and neck stiffness.  Skin: Negative for rash.  Allergic/Immunologic: Negative for immunocompromised state.  Neurological: Negative for dizziness, weakness, light-headedness,  numbness and headaches.  All other systems reviewed and are negative.    Physical Exam Updated Vital Signs BP 123/89   Pulse 72   Temp 98.4 F (36.9 C) (Oral)   Resp 16   SpO2 96%   Physical Exam  Constitutional: He appears well-developed and well-nourished. No distress.  HENT:  Head: Normocephalic and atraumatic.  Eyes: Conjunctivae are normal.  Neck: Neck supple.  Cardiovascular: Normal rate, regular rhythm and normal heart sounds.   Pulmonary/Chest: Effort normal.  No respiratory distress. He has no wheezes. He has no rales. He exhibits tenderness.    Right lower rib and intercostal muscle tenderness anteriorly.  Abdominal: Soft. Bowel sounds are normal. He exhibits no distension. There is no tenderness. There is no rebound.  Musculoskeletal: He exhibits no edema.  Neurological: He is alert.  Skin: Skin is warm and dry.  Nursing note and vitals reviewed.    ED Treatments / Results  Labs (all labs ordered are listed, but only abnormal results are displayed) Labs Reviewed  COMPREHENSIVE METABOLIC PANEL - Abnormal; Notable for the following:       Result Value   Glucose, Bld 110 (*)    Calcium 8.7 (*)    AST 86 (*)    ALT 78 (*)    Alkaline Phosphatase 169 (*)    All other components within normal limits  LIPASE, BLOOD  CBC  URINALYSIS, ROUTINE W REFLEX MICROSCOPIC    EKG  EKG Interpretation None       Radiology Dg Chest 2 View  Result Date: 07/11/2017 CLINICAL DATA:  Right-sided chest pain, anterior right rib pain after walking EXAM: CHEST  2 VIEW COMPARISON:  Chest x-ray of 06/28/2017 FINDINGS: No active infiltrate or effusion is seen. The lungs are not optimally aerated. Mediastinal and hilar contours are unremarkable. No acute bony abnormality is seen. IMPRESSION: Suboptimal inspiration but no evidence of active cardiopulmonary disease. Electronically Signed   By: Dwyane Dee M.D.   On: 07/11/2017 10:54   Dg Abd 2 Views  Result Date: 07/11/2017 CLINICAL DATA:  Right anterior rib pain after walking EXAM: ABDOMEN - 2 VIEW COMPARISON:  Lumbar spine film of 06/28/2017 FINDINGS: Supine and erect views of the abdomen show no bowel obstruction. No free air is seen, although the entire right hemidiaphragm is not included. There is no evidence by plain film of constipation. There are calcifications in the low left bony pelvis which may be phleboliths, but a distal left ureteral calculus would be difficult to exclude. No bony abnormality is  noted. IMPRESSION: 1. No bowel obstruction.  No free air.  See above. 2. Calcifications low in the left bony pelvis. Possible phleboliths but cannot exclude distal left ureteral calculus by plain film. Electronically Signed   By: Dwyane Dee M.D.   On: 07/11/2017 10:57    Procedures Procedures (including critical care time)  Medications Ordered in ED Medications - No data to display   Initial Impression / Assessment and Plan / ED Course  I have reviewed the triage vital signs and the nursing notes.  Pertinent labs & imaging results that were available during my care of the patient were reviewed by me and considered in my medical decision making (see chart for details).     Patient in emergency department with right lower rib/right upper abdominal pain onset yesterday. No other gastrointestinal symptoms, specifically no nausea or vomiting or difficulty eating. And no pulmonary symptoms, no shortness of breath or cough.  PERC negative, doubt PE. Labs  obtained at triage, normal, other than a mildly elevated LFTs, which are actually improved from old values. Question musculoskeletal pain, versus hepatitis, versus pneumonia, versus constipation. He is otherwise nontoxic appearing. Will add x-rays of chest and abdomen.  11:32 AM X-rays are negative. Patient is in no acute distress, nontoxic appearing. I suspect the pain is most likely musculoskeletal. I will add Robaxin to his medication list. Advised not to lift anything heavy. Patient requested a work note, will provide a note for today and tomorrow. Will have a follow-up with his family doctor as needed.  Vitals:   07/11/17 0616 07/11/17 0843 07/11/17 0915 07/11/17 1015  BP: (!) 144/86 129/87 123/89 105/70  Pulse: 88 71 72 71  Resp: 16 16 16    Temp: 98.4 F (36.9 C)     TempSrc: Oral     SpO2: 94% 97% 96% 96%     Final Clinical Impressions(s) / ED Diagnoses   Final diagnoses:  Abdominal pain  Chest wall pain    New  Prescriptions New Prescriptions   METHOCARBAMOL (ROBAXIN) 500 MG TABLET    Take 1 tablet (500 mg total) by mouth 2 (two) times daily.     Jaynie CrumbleKirichenko, Pinchus Weckwerth, PA-C 07/11/17 1139    Doug SouJacubowitz, Sam, MD 07/11/17 1452

## 2017-07-11 NOTE — ED Triage Notes (Signed)
Patient states that he has been having some right flank pain, states that it feels like a cramp.  Patient states that he does not have any nausea or vomiting, no pain with urination.  He states that it hurts when he bends over.  Patient states that he has Hep C and sciatica.

## 2017-07-11 NOTE — Discharge Instructions (Signed)
Avoid any heavy lifting. Continue to apply ice packs several times a day. Take Robaxin as prescribed. Follow-up with family doctor as needed.

## 2017-07-15 ENCOUNTER — Emergency Department (HOSPITAL_COMMUNITY): Payer: BLUE CROSS/BLUE SHIELD

## 2017-07-15 ENCOUNTER — Emergency Department (HOSPITAL_COMMUNITY)
Admission: EM | Admit: 2017-07-15 | Discharge: 2017-07-15 | Disposition: A | Discharge status: Home / Self Care | Payer: BLUE CROSS/BLUE SHIELD | Attending: Emergency Medicine | Admitting: Emergency Medicine

## 2017-07-15 ENCOUNTER — Encounter (HOSPITAL_COMMUNITY): Payer: Self-pay | Admitting: Emergency Medicine

## 2017-07-15 DIAGNOSIS — R1011 Right upper quadrant pain: Secondary | ICD-10-CM | POA: Insufficient documentation

## 2017-07-15 DIAGNOSIS — F17210 Nicotine dependence, cigarettes, uncomplicated: Secondary | ICD-10-CM | POA: Diagnosis not present

## 2017-07-15 DIAGNOSIS — R1031 Right lower quadrant pain: Secondary | ICD-10-CM | POA: Diagnosis not present

## 2017-07-15 DIAGNOSIS — Z791 Long term (current) use of non-steroidal anti-inflammatories (NSAID): Secondary | ICD-10-CM | POA: Diagnosis not present

## 2017-07-15 DIAGNOSIS — Z79899 Other long term (current) drug therapy: Secondary | ICD-10-CM | POA: Diagnosis not present

## 2017-07-15 DIAGNOSIS — Z8619 Personal history of other infectious and parasitic diseases: Secondary | ICD-10-CM | POA: Diagnosis not present

## 2017-07-15 DIAGNOSIS — R1013 Epigastric pain: Secondary | ICD-10-CM | POA: Diagnosis not present

## 2017-07-15 LAB — URINALYSIS, ROUTINE W REFLEX MICROSCOPIC
Bilirubin Urine: NEGATIVE
GLUCOSE, UA: NEGATIVE mg/dL
HGB URINE DIPSTICK: NEGATIVE
Ketones, ur: NEGATIVE mg/dL
LEUKOCYTES UA: NEGATIVE
Nitrite: NEGATIVE
Protein, ur: NEGATIVE mg/dL
Specific Gravity, Urine: 1.019 (ref 1.005–1.030)
pH: 6 (ref 5.0–8.0)

## 2017-07-15 LAB — CBC WITH DIFFERENTIAL/PLATELET
BASOS ABS: 0 10*3/uL (ref 0.0–0.1)
BASOS PCT: 0 %
EOS ABS: 0 10*3/uL (ref 0.0–0.7)
EOS PCT: 0 %
HCT: 48.7 % (ref 39.0–52.0)
Hemoglobin: 16.6 g/dL (ref 13.0–17.0)
Lymphocytes Relative: 16 %
Lymphs Abs: 2.2 10*3/uL (ref 0.7–4.0)
MCH: 31.3 pg (ref 26.0–34.0)
MCHC: 34.1 g/dL (ref 30.0–36.0)
MCV: 91.9 fL (ref 78.0–100.0)
MONO ABS: 0.8 10*3/uL (ref 0.1–1.0)
Monocytes Relative: 6 %
Neutro Abs: 10.8 10*3/uL — ABNORMAL HIGH (ref 1.7–7.7)
Neutrophils Relative %: 78 %
PLATELETS: 176 10*3/uL (ref 150–400)
RBC: 5.3 MIL/uL (ref 4.22–5.81)
RDW: 12.9 % (ref 11.5–15.5)
WBC: 13.8 10*3/uL — ABNORMAL HIGH (ref 4.0–10.5)

## 2017-07-15 LAB — COMPREHENSIVE METABOLIC PANEL
ALT: 95 U/L — ABNORMAL HIGH (ref 17–63)
AST: 112 U/L — AB (ref 15–41)
Albumin: 4 g/dL (ref 3.5–5.0)
Alkaline Phosphatase: 192 U/L — ABNORMAL HIGH (ref 38–126)
Anion gap: 9 (ref 5–15)
BILIRUBIN TOTAL: 1.1 mg/dL (ref 0.3–1.2)
BUN: 7 mg/dL (ref 6–20)
CALCIUM: 9.2 mg/dL (ref 8.9–10.3)
CO2: 26 mmol/L (ref 22–32)
Chloride: 102 mmol/L (ref 101–111)
Creatinine, Ser: 0.96 mg/dL (ref 0.61–1.24)
GFR calc Af Amer: >60 mL/min (ref >60)
Glucose, Bld: 89 mg/dL (ref 65–99)
Potassium: 4.4 mmol/L (ref 3.5–5.1)
Sodium: 137 mmol/L (ref 135–145)
TOTAL PROTEIN: 8.8 g/dL — AB (ref 6.5–8.1)

## 2017-07-15 LAB — LIPASE, BLOOD: LIPASE: 24 U/L (ref 11–51)

## 2017-07-15 MED ORDER — KETOROLAC TROMETHAMINE 30 MG/ML IJ SOLN
30.0000 mg | Freq: Once | INTRAMUSCULAR | Status: AC
  Administered 2017-07-15: 30 mg via INTRAVENOUS
  Filled 2017-07-15: qty 1

## 2017-07-15 MED ORDER — NAPROXEN 500 MG PO TABS: 500.0000 mg | ORAL_TABLET | Freq: Three times a day (TID) | ORAL | 0 refills | Status: DC | PRN

## 2017-07-15 MED ORDER — SODIUM CHLORIDE 0.9 % IV BOLUS (SEPSIS)
1000.0000 mL | Freq: Once | INTRAVENOUS | Status: AC
  Administered 2017-07-15: 1000 mL via INTRAVENOUS

## 2017-07-15 NOTE — ED Provider Notes (Signed)
[facility]Y DEPT Provider Note   CSN: [id number] Arrival date & time: 07/15/17  1013     History   Chief Complaint Chief Complaint  Patient presents with  . Flank Pain    HPI Kenneth Brooks is a 41 y.o. male.  Patient is a 41 year old male with past medical history of opiate abuse on methadone, anxiety, hepatitis C.He presents today for evaluation of pain in his right lateral abdomen. This is been present for the past week. He was seen here several days ago and diagnosed with musculoskeletal pain. He returns today stating that his pain is no better, in fact it is somewhat worse. His pain is to his right lateral abdomen and is worse with breathing and movement. He denies any fevers or chills. He denies any bowel or bladder complaints.   The history is provided by the patient.  Flank Pain  This is a new problem. Episode onset: 1 week ago. The problem occurs constantly. The problem has been gradually worsening. Pertinent negatives include no chest pain and no shortness of breath. Exacerbated by: breathing, movement, and palpation. Nothing relieves the symptoms.    Past Medical History:  Diagnosis Date  . Anxiety   . Panic attack     Patient Active Problem List   Diagnosis Date Noted  . Hepatitis C 07/01/2017    History reviewed. No pertinent surgical history.     Home Medications    Prior to Admission medications   Medication Sig Start Date End Date Taking? Authorizing Provider  gabapentin (NEURONTIN) 300 MG capsule Take 1 capsule (300 mg total) by mouth 3 (three) times daily. 06/28/17   Ofilia Neaslark, Michael L, PA-C  lactobacillus acidophilus (BACID) TABS tablet Take 2 tablets by mouth 3 (three) times daily.    [provider]  meloxicam (MOBIC) 15 MG tablet Take 0.5-1 tablets (7.5-15 mg total) by mouth daily. Take with food. Do not take Ibuprofen, Goody's, or Aleve while taking this medication. 07/01/17 07/31/17  Ofilia Neaslark, Michael L, PA-C  METHADONE HCL PO Take by  mouth.    [provider]  methocarbamol (ROBAXIN) 500 MG tablet Take 1 tablet (500 mg total) by mouth 2 (two) times daily. 07/11/17   Kirichenko, Lemont Fillersatyana, PA-C  varenicline (CHANTIX CONTINUING MONTH PAK) 1 MG tablet Take 1 tablet (1 mg total) by mouth 2 (two) times daily. 06/28/17   Ofilia Neaslark, Michael L, PA-C  varenicline (CHANTIX STARTING MONTH PAK) 0.5 MG X 11 & 1 MG X 42 tablet Take one 0.5 mg tablet by mouth once daily for 3 days, then increase to one 0.5 mg tablet twice daily for 4 days, then increase to one 1 mg tablet twice daily. 06/28/17   Ofilia Neaslark, Michael L, PA-C    Family History Family History  Problem Relation Age of Onset  . Family history unknown: Yes    Social History Social History  Substance Use Topics  . Smoking status: Current Every Day Smoker    Packs/day: 0.50    Types: Cigarettes  . Smokeless tobacco: Never Used  . Alcohol use No     Allergies   Bee venom   Review of Systems Review of Systems  Respiratory: Negative for shortness of breath.   Cardiovascular: Negative for chest pain.  Genitourinary: Positive for flank pain.  All other systems reviewed and are negative.    Physical Exam Updated Vital Signs BP (!) 152/100 (BP Location: Right Arm)   Pulse (!) 103   Temp 98.4 F (36.9 C) (Oral)   Resp  20   Ht 6' (1.829 m)   Wt 119.3 kg (263 lb)   SpO2 93%   BMI 35.67 kg/m   Physical Exam  Constitutional: He is oriented to person, place, and time. He appears well-developed and well-nourished. No distress.  Patient is a 41 year old male who appears uncomfortable.   HENT:  Head: Normocephalic and atraumatic.  Mouth/Throat: Oropharynx is clear and moist.  Neck: Normal range of motion. Neck supple.  Cardiovascular: Normal rate and regular rhythm.  Exam reveals no friction rub.   No murmur heard. Pulmonary/Chest: Effort normal and breath sounds normal. No respiratory distress. He has no wheezes. He has no rales.  Abdominal: Soft. Bowel sounds are  normal. He exhibits no distension. There is tenderness. There is no rebound and no guarding.  There is mild tenderness to palpation of the right upper quadrant, right lower quadrant, and right lower abdomen.  Musculoskeletal: Normal range of motion. He exhibits no edema.  Neurological: He is alert and oriented to person, place, and time. Coordination normal.  Skin: Skin is warm and dry. He is not diaphoretic.  Nursing note and vitals reviewed.    ED Treatments / Results  Labs (all labs ordered are listed, but only abnormal results are displayed) Labs Reviewed  URINALYSIS, ROUTINE W REFLEX MICROSCOPIC - Abnormal; Notable for the following:       Result Value   Color, Urine AMBER (*)    All other components within normal limits  COMPREHENSIVE METABOLIC PANEL  LIPASE, BLOOD  CBC WITH DIFFERENTIAL/PLATELET    EKG  EKG Interpretation None       Radiology No results found.  Procedures Procedures (including critical care time)  Medications Ordered in ED Medications  sodium chloride 0.9 % bolus 1,000 mL (not administered)  ketorolac (TORADOL) 30 MG/ML injection 30 mg (not administered)     Initial Impression / Assessment and Plan / ED Course  I have reviewed the triage vital signs and the nursing notes.  Pertinent labs & imaging results that were available during my care of the patient were reviewed by me and considered in my medical decision making (see chart for details).  Patient presents with right upper quadrant pain. His workup today reveals mild elevations of his LFTs consistent with baseline. Renal CT shows no evidence for stone, however suggest a distended gallbladder. They're recommending an ultrasound. This was obtained and reveals mild distention of the gallbladder with no stone or findings consistent with acute cholecystitis.The ultrasound does suggest hepatocellular disease with cirrhosis area this may be related to his hepatitis C. He has never been seen by  infectious disease or gastroenterology as this was diagnosed by blood work at his methadone clinic. He will be prescribed naproxen and advised to follow-up with both GI and ID. Contact information will be given. To return as needed for any problems.  Final Clinical Impressions(s) / ED Diagnoses   Final diagnoses:  None    New Prescriptions New Prescriptions   No medications on file     Geoffery Lyons, MD 07/15/17 [phone]

## 2017-07-15 NOTE — ED Notes (Signed)
Patient taken to ultrasound.

## 2017-07-15 NOTE — ED Notes (Signed)
Rt flank pain for a few days states was seen for same last week but states he doesn't feel better

## 2017-07-15 NOTE — ED Triage Notes (Signed)
Pt sts right sided flank pain x 1 week; pt seen here last week for same with negative workup

## 2017-07-15 NOTE — Discharge Instructions (Signed)
Naproxen as prescribed.  You need to follow up with both gastroenterology and infectious disease. The contact information for [facility] and Dr. Zenaida NieceVan dam have been provided in this discharge summary for you to call and make these arrangements.

## 2017-07-31 ENCOUNTER — Encounter: Payer: Self-pay | Admitting: Internal Medicine

## 2017-07-31 ENCOUNTER — Ambulatory Visit (INDEPENDENT_AMBULATORY_CARE_PROVIDER_SITE_OTHER): Payer: BLUE CROSS/BLUE SHIELD | Admitting: Internal Medicine

## 2017-07-31 ENCOUNTER — Encounter: Payer: Self-pay | Admitting: Gastroenterology

## 2017-07-31 VITALS — BP 154/95 | HR 69 | Temp 97.7°F | Ht 72.0 in | Wt 265.0 lb

## 2017-07-31 DIAGNOSIS — B182 Chronic viral hepatitis C: Secondary | ICD-10-CM

## 2017-07-31 DIAGNOSIS — K7460 Unspecified cirrhosis of liver: Secondary | ICD-10-CM | POA: Insufficient documentation

## 2017-07-31 DIAGNOSIS — Z23 Encounter for immunization: Secondary | ICD-10-CM | POA: Diagnosis not present

## 2017-07-31 DIAGNOSIS — K717 Toxic liver disease with fibrosis and cirrhosis of liver: Secondary | ICD-10-CM | POA: Diagnosis not present

## 2017-07-31 LAB — PROTIME-INR
INR: 1.1
Prothrombin Time: 11.9 s — ABNORMAL HIGH (ref 9.0–11.5)

## 2017-07-31 NOTE — Progress Notes (Signed)
[facility]   CC: consideration for treatment for chronic hepatitis C  HPI:  +Kenneth Brooks is a 41 y.o. male who presents for initial evaluation and management of chronic hepatitis C.  Patient tested positive remotely. Hepatitis C-associated risk factors present are: IV drug abuse (details: last used over 1 year ago). Patient denies renal dialysis, sexual contact with person with liver disease. Patient has had other studies performed. Results: hepatitis C RNA by PCR, result: positive. Patient has not had prior treatment for Hepatitis C. Patient does have a past history of liver disease. Patient does not have a family history of liver disease. Patient does not  have associated signs or symptoms related to liver disease.  Labs reviewed and confirm chronic hepatitis C with a positive viral load. Ultrasound reviewed in Epic and has cirrhosis.        Patient does not have documented immunity to Hepatitis A. Patient does have documented immunity to Hepatitis B.    Review of Systems:  Constitutional: negative for fatigue and malaise Gastrointestinal: positive for change in bowel habits, diarrhea and some blood in stools, negative for nausea and vomiting Integument/breast: negative for rash Musculoskeletal: negative for myalgias and arthralgias All other systems reviewed and are negative       Past Medical History:  Diagnosis Date  . Anxiety   . Panic attack     Prior to Admission medications   Medication Sig Start Date End Date Taking? Authorizing Provider  gabapentin (NEURONTIN) 300 MG capsule Take 1 capsule (300 mg total) by mouth 3 (three) times daily. 06/28/17   Ofilia Neas, PA-C  lactobacillus acidophilus (BACID) TABS tablet Take 2 tablets by mouth 3 (three) times daily.    [provider]  meloxicam (MOBIC) 15 MG tablet Take 0.5-1 tablets (7.5-15 mg total) by mouth daily. Take with food. Do not take Ibuprofen, Goody's, or Aleve while  taking this medication. 07/01/17 07/31/17  Ofilia Neas, PA-C  METHADONE HCL PO Take by mouth.    [provider]  methocarbamol (ROBAXIN) 500 MG tablet Take 1 tablet (500 mg total) by mouth 2 (two) times daily. 07/11/17   Kirichenko, Tatyana, PA-C  naproxen (NAPROSYN) 500 MG tablet Take 1 tablet (500 mg total) by mouth 3 (three) times daily as needed. 07/15/17   Geoffery Lyons, MD  varenicline (CHANTIX CONTINUING MONTH PAK) 1 MG tablet Take 1 tablet (1 mg total) by mouth 2 (two) times daily. 06/28/17   Ofilia Neas, PA-C  varenicline (CHANTIX STARTING MONTH PAK) 0.5 MG X 11 & 1 MG X 42 tablet Take one 0.5 mg tablet by mouth once daily for 3 days, then increase to one 0.5 mg tablet twice daily for 4 days, then increase to one 1 mg tablet twice daily. 06/28/17   Ofilia Neas, PA-C    Allergies  Allergen Reactions  . Bee Venom Shortness Of Breath    Social History  Substance Use Topics  . Smoking status: Current Every Day Smoker    Packs/day: 0.50    Types: Cigarettes  . Smokeless tobacco: Never Used  . Alcohol use No    Family History  Problem Relation Age of Onset  . Family history unknown: Yes     Objective:  Constitutional: in no apparent distress and alert,  Vitals:   07/31/17 0901  Weight: 265 lb (120.2 kg)  Height: 6' (1.829 m)   Eyes: anicteric Cardiovascular: Cor RRR Respiratory: CTA B; normal respiratory effort Gastrointestinal:  Bowel sounds are normal, liver is not enlarged, spleen is not enlarged Musculoskeletal: no pedal edema noted Skin: negatives: no rash; no porphyria cutanea tarda Lymphatic: no cervical lymphadenopathy   Laboratory Genotype: No results found for: HCVGENOTYPE HCV viral load: No results found for: HCVQUANT Lab Results  Component Value Date   WBC 13.8 (H) 07/15/2017   HGB 16.6 07/15/2017   HCT 48.7 07/15/2017   MCV 91.9 07/15/2017   PLT 176 07/15/2017    Lab Results  Component Value Date   CREATININE 0.96 07/15/2017    BUN 7 07/15/2017   NA 137 07/15/2017   K 4.4 07/15/2017   CL 102 07/15/2017   CO2 26 07/15/2017    Lab Results  Component Value Date   ALT 95 (H) 07/15/2017   AST 112 (H) 07/15/2017   ALKPHOS 192 (H) 07/15/2017     Labs and history reviewed and show CHILD-PUGH A  5-6 points: Child class A 7-9 points: Child class B 10-15 points: Child class C  Lab Results  Component Value Date   INR 1.2 06/28/2017   BILITOT 1.1 07/15/2017   ALBUMIN 4.0 07/15/2017     Assessment: New Patient with Chronic Hepatitis C genotype unknown, untreated.  I discussed with the patient the lab findings that confirm chronic hepatitis C as well as the natural history and progression of disease including about 30% of people who develop cirrhosis of the liver if left untreated and once cirrhosis is established there is a 2-7% risk per year of liver cancer and liver failure.  I discussed the importance of treatment and benefits in reducing the risk, even if significant liver fibrosis exists.   Plan: 1) Patient counseled extensively on limiting acetaminophen to no more than 2 grams daily, avoidance of alcohol. 2) Transmission discussed with patient including sexual transmission, sharing razors and toothbrush.   3) Will need referral to gastroenterology if concern for cirrhosis 4) Will need referral for substance abuse counseling: No.; Further work up to include urine drug screen  No. 5) Will prescribe appropriate medication based on genotype and coverage - BCBS, cirrhosis 6) Hepatitis A titers 7) Pneumovax vaccine given 9) will follow up after starting medication

## 2017-07-31 NOTE — Patient Instructions (Signed)
Date 07/31/17  Dear [name], As discussed in the ID Clinic, your hepatitis C therapy will include highly effective medication(s) for treatment and will vary based on the type of hepatitis C and insurance approval.  Potential medications include:          Harvoni (sofosbuvir 90mg /ledipasvir 400mg ) tablet oral daily          OR     Epclusa (sofosbuvir 400mg /velpatasvir 100mg ) tablet oral daily          OR      Mavyret (glecaprevir 100 mg/pibrentasvir 40 mg): Take 3 tablets oral daily          OR     Zepatier (elbasvir 50 mg/grazoprevir 100 mg) oral daily, +/- ribavirin              Medications are typically for 8 or 12 weeks total ---------------------------------------------------------------- Your HCV Treatment Start Date: You will be notified by our office once the medication is approved and where you can pick it up (or if mailed)   ---------------------------------------------------------------- YOUR PHARMACY CONTACT:   [facility] [address] [place], [place] [address] Phone: [phone] Hours: Monday to Friday 7:30 am to 6:00 pm   Please always contact your pharmacy at least 3-4 business days before you run out of medications to ensure your next month's medication is ready or 1 week prior to running out if you receive it by mail.  Remember, each prescription is for 28 days. ---------------------------------------------------------------- GENERAL NOTES REGARDING YOUR HEPATITIS C MEDICATION:  Some medications have the following interactions:  - Acid reducing agents such as H2 blockers (ie. Pepcid (famotidine), Zantac (ranitidine), Tagamet (cimetidine), Axid (nizatidine) and proton pump inhibitors (ie. Prilosec (omeprazole), Protonix (pantoprazole), Nexium (esomeprazole), or Aciphex (rabeprazole)). Do not take until you have discussed with a health care provider.    -Antacids that contain magnesium and/or aluminum hydroxide (ie. Milk of Magensia, Rolaids,  Gaviscon, Maalox, Mylanta, an dArthritis Pain Formula).  -Calcium carbonate (calcium supplements or antacids such as Tums, Caltrate, Os-Cal).  -St. John's wort or any products that contain St. John's wort like some herbal supplements  Please inform the office prior to starting any of these medications.  - The common side effects associated with Harvoni include:      1. Fatigue      2. Headache      3. Nausea      4. Diarrhea      5. Insomnia  Please note that this only lists the most common side effects and is NOT a comprehensive list of the potential side effects of these medications. For more information, please review the drug information sheets that come with your medication package from the pharmacy.  ---------------------------------------------------------------- GENERAL HELPFUL HINTS ON HCV THERAPY: 1. Stay well-hydrated. 2. Notify the ID Clinic of any changes in your other over-the-counter/herbal or prescription medications. 3. If you miss a dose of your medication, take the missed dose as soon as you remember. Return to your regular time/dose schedule the next day.  4.  Do not stop taking your medications without first talking with your healthcare provider. 5.  You may take Tylenol (acetaminophen), as long as the dose is less than 2000 mg (OR no more than 4 tablets of the Tylenol Extra Strengths 500mg  tablet) in 24 hours. 6.  You will see our pharmacist-specialist within the first 2 weeks of starting your medication to monitor for any possible side effects. 7.  You will have labs once during treatment, soon  after treatment completion and one final lab 6 months after treatment completion to verify the virus is out of your system.  Scharlene Gloss, [place] for [place], [place]  [phone]

## 2017-08-03 LAB — HCV RNA, QN PCR RFLX GENO, LIPA
HCV RNA, PCR, QN: 149000 [IU]/mL — AB
HCV RNA, PCR, QN: 5.17 log IU/mL — ABNORMAL HIGH

## 2017-08-03 LAB — HEPATITIS C GENOTYPE

## 2017-08-04 ENCOUNTER — Other Ambulatory Visit: Payer: Self-pay | Admitting: Internal Medicine

## 2017-08-04 MED ORDER — LEDIPASVIR-SOFOSBUVIR 90-400 MG PO TABS: 1.0000 | ORAL_TABLET | Freq: Every day | ORAL | 2 refills | Status: DC

## 2017-08-06 LAB — HEPATITIS A ANTIBODY, TOTAL: Hep A Total Ab: NONREACTIVE

## 2017-08-07 ENCOUNTER — Other Ambulatory Visit: Payer: Self-pay | Admitting: Internal Medicine

## 2017-08-12 ENCOUNTER — Other Ambulatory Visit: Payer: Self-pay | Admitting: Pharmacist Clinician (PhC)/ Clinical Pharmacy Specialist

## 2017-08-12 MED ORDER — LEDIPASVIR-SOFOSBUVIR 90-400 MG PO TABS: 1.0000 | ORAL_TABLET | Freq: Every day | ORAL | 2 refills | Status: DC

## 2017-08-12 NOTE — Progress Notes (Signed)
Harvoni has to be tx to a specialty. Tx to [facility].

## 2017-08-15 ENCOUNTER — Ambulatory Visit: Payer: PRIVATE HEALTH INSURANCE | Admitting: Gastroenterology

## 2017-08-20 ENCOUNTER — Telehealth: Payer: Self-pay | Admitting: Pharmacist

## 2017-08-20 ENCOUNTER — Encounter: Payer: Self-pay | Admitting: Pharmacy Technician

## 2017-08-20 ENCOUNTER — Other Ambulatory Visit: Payer: Self-pay | Admitting: Pharmacist

## 2017-08-20 NOTE — Telephone Encounter (Signed)
Kenneth Brooks called with some questions regarding medications he can take with Harvoni.  He complains of severe constipation since he is on Methadone.  He was wondering if he could continue his Fleet enemas that he uses and mag citrate twice per week.  I told him to separate the Harvoni and the mag citrate. I also told him it was fine to take the Fleet enema with the Harvoni.  I also advised him to take daily Miralax to try and help some as well.  He states he has suffered from this for a while and he is seeing a GI doctor soon.  I told him to call me with any further questions.

## 2017-08-27 ENCOUNTER — Encounter: Payer: Self-pay | Admitting: Gastroenterology

## 2017-08-27 ENCOUNTER — Other Ambulatory Visit (INDEPENDENT_AMBULATORY_CARE_PROVIDER_SITE_OTHER): Payer: BLUE CROSS/BLUE SHIELD

## 2017-08-27 ENCOUNTER — Ambulatory Visit (INDEPENDENT_AMBULATORY_CARE_PROVIDER_SITE_OTHER): Payer: BLUE CROSS/BLUE SHIELD | Admitting: Gastroenterology

## 2017-08-27 VITALS — BP 108/66 | HR 72 | Ht 72.34 in | Wt 261.4 lb

## 2017-08-27 DIAGNOSIS — B182 Chronic viral hepatitis C: Secondary | ICD-10-CM

## 2017-08-27 DIAGNOSIS — K717 Toxic liver disease with fibrosis and cirrhosis of liver: Secondary | ICD-10-CM | POA: Diagnosis not present

## 2017-08-27 DIAGNOSIS — K625 Hemorrhage of anus and rectum: Secondary | ICD-10-CM

## 2017-08-27 DIAGNOSIS — R938 Abnormal findings on diagnostic imaging of other specified body structures: Secondary | ICD-10-CM | POA: Diagnosis not present

## 2017-08-27 DIAGNOSIS — R9389 Abnormal findings on diagnostic imaging of other specified body structures: Secondary | ICD-10-CM

## 2017-08-27 DIAGNOSIS — K5900 Constipation, unspecified: Secondary | ICD-10-CM | POA: Diagnosis not present

## 2017-08-27 LAB — BASIC METABOLIC PANEL
BUN: 9 mg/dL (ref 6–23)
CHLORIDE: 103 meq/L (ref 96–112)
CO2: 31 mEq/L (ref 19–32)
CREATININE: 0.77 mg/dL (ref 0.40–1.50)
Calcium: 9 mg/dL (ref 8.4–10.5)
GFR: 118.85 mL/min (ref >60.00)
Glucose, Bld: 77 mg/dL (ref 70–99)
Potassium: 4.2 mEq/L (ref 3.5–5.1)
SODIUM: 137 meq/L (ref 135–145)

## 2017-08-27 LAB — CBC WITH DIFFERENTIAL/PLATELET
BASOS ABS: 0.1 10*3/uL (ref 0.0–0.1)
Basophils Relative: 0.9 % (ref 0.0–3.0)
EOS ABS: 0.4 10*3/uL (ref 0.0–0.7)
Eosinophils Relative: 6.1 % — ABNORMAL HIGH (ref 0.0–5.0)
HCT: 46.2 % (ref 39.0–52.0)
Hemoglobin: 15.5 g/dL (ref 13.0–17.0)
LYMPHS ABS: 3 10*3/uL (ref 0.7–4.0)
Lymphocytes Relative: 42.1 % (ref 12.0–46.0)
MCHC: 33.6 g/dL (ref 30.0–36.0)
MCV: 93.3 fl (ref 78.0–100.0)
MONO ABS: 0.5 10*3/uL (ref 0.1–1.0)
MONOS PCT: 7.4 % (ref 3.0–12.0)
NEUTROS ABS: 3.1 10*3/uL (ref 1.4–7.7)
Neutrophils Relative %: 43.5 % (ref 43.0–77.0)
PLATELETS: 174 10*3/uL (ref 150.0–400.0)
RBC: 4.95 Mil/uL (ref 4.22–5.81)
RDW: 12.8 % (ref 11.5–15.5)
WBC: 7.2 10*3/uL (ref 4.0–10.5)

## 2017-08-27 LAB — PROTIME-INR
INR: 1.4 ratio — AB (ref 0.8–1.0)
PROTHROMBIN TIME: 14.6 s — AB (ref 9.6–13.1)

## 2017-08-27 LAB — HEPATIC FUNCTION PANEL
ALK PHOS: 142 U/L — AB (ref 39–117)
ALT: 25 U/L (ref 0–53)
AST: 33 U/L (ref 0–37)
Albumin: 3.6 g/dL (ref 3.5–5.2)
BILIRUBIN DIRECT: 0.3 mg/dL (ref 0.0–0.3)
BILIRUBIN TOTAL: 0.8 mg/dL (ref 0.2–1.2)
Total Protein: 7.6 g/dL (ref 6.0–8.3)

## 2017-08-27 LAB — IBC PANEL
Iron: 120 ug/dL (ref 42–165)
SATURATION RATIOS: 34.8 % (ref 20.0–50.0)
TRANSFERRIN: 246 mg/dL (ref 212.0–360.0)

## 2017-08-27 LAB — FERRITIN: Ferritin: 105.8 ng/mL (ref 22.0–322.0)

## 2017-08-27 MED ORDER — NA SULFATE-K SULFATE-MG SULF 17.5-3.13-1.6 GM/177ML PO SOLN: ORAL | 0 refills | Status: DC

## 2017-08-27 NOTE — Patient Instructions (Addendum)
Please go to the basement level to have your labs drawn. You have been scheduled for a colonoscopy and endoscopy. Please follow written instructions given to you at your visit today.  Please pick up your prep supplies at the pharmacy within the next 1-3 days. [facility] [address]  If you use inhalers (even only as needed), please bring them with you on the day of your procedure. Your physician has requested that you go to www.startemmi.com and enter the access code given to you at your visit today. This web site gives a general overview about your procedure. However, you should still follow specific instructions given to you by our office regarding your preparation for the procedure.  Begin Miralax daily, 1 dose is 17 grams in 8 oz of water.   IIf you are age 73 or younger, your body mass index should be between 19-25. Your Body mass index is 35.11 kg/m. If this is out of the aformentioned range listed, please consider follow up with your Primary Care Provider.

## 2017-08-27 NOTE — Progress Notes (Signed)
08/27/2017 Kenneth Brooks [id number] Jan 11, 1976   HISTORY OF PRESENT ILLNESS:  This is a 41 year old male who was referred to our practice by Dr. Luciana Axe of infectious disease. The patient was recently found to be positive for hepatitis C and is currently undergoing treatment with Harvoni.  Recent ultrasound showed that the liver has a heterogeneous nodular parenchymal pattern with irregular contour suggesting hepatocellular disease with cirrhosis but no focal hepatic abnormalities were identified. No gallstones or biliary distention was noted. The gallbladder is slightly distended.  This was actually discovered recently when he presented to the emergency room in July for complaints of right upper quadrant abdominal pain. He was found have an alkaline phosphatase of 194, AST 1:30, ALT 110, total bili normal at 0.6. Hepatitis B surface antigen was negative, but hepatitis C was positive. INR is 1.1 and platelets are normal at 176.  He actually presented to the emergency department with complaints of right upper quadrant abdominal pain. He states that this began rather suddenly one day when he was riding a bus to work. The pain was persistent for about a month, present constantly for most of that period of time, but much worse at certain points. That is what prompted his evaluation. He also had a CT scan of the abdomen and pelvis without contrast as a renal stone protocol that showed the following:  "IMPRESSION: Small amount of fluid sub hepatic region and Morison's pouch with tiny amount of fluid within the lower pelvis. Source indeterminate. There is a small segment of sigmoid colon with circumferential thickening which may represent changes of mild colitis as this is immediately adjacent to pelvic free fluid. No other discrete cause of free fluid identified.  Gallbladder is distended without calcified gallstone. Fluid does not appear centered at the level gallbladder. If there were a suspicion for  primary gallbladder abnormality, ultrasound could be obtained further delineation.  No renal or ureteral obstructing stone or hydronephrosis.  Lobulated liver suggestive of changes of mild cirrhosis and mild fatty infiltration.  Splenomegaly with spleen spanning over 14 cm.  Increase number for upper abdominal lymph nodes and adenopathy interaortocaval region of indeterminate etiology, possibly reactive.  Bibasilar subsegmental atelectasis may be related to splinting."   At this point in his abdominal pain has completely resolved for about 2 weeks now. He has not even experienced a slight discomfort in that area. When the pain was present it was localized in that area, but he had really no other associated symptoms.  The patient reports chronic constipation likely due to his methadone and gabapentin use. He says that he has not been taking anything regularly for constipation except for probiotics. Has been using magnesium citrate to get a good cleanout every couple of weeks or so.  He reports quite frequent rectal bleeding with bright red blood in the toilet paper and in the toilet bowl.    Past Medical History:  Diagnosis Date  . Anxiety   . Cirrhosis (HCC)   . Depression   . Hepatitis C   . Meningitis    spinal  . Panic attack    Past Surgical History:  Procedure Laterality Date  . Spinal Tap      reports that he has been smoking Cigarettes.  He started smoking about 29 years ago. He has been smoking about 0.50 packs per day. He has never used smokeless tobacco. He reports that he does not use drugs. His alcohol history is not on file. family history includes Cancer  in his maternal grandmother; Cirrhosis in his mother; Crohn's disease in his brother; Heart attack in his father; Hypertension in his father; Liver cancer in his mother; Stroke (age of onset: 81) in his father. Allergies  Allergen Reactions  . Bee Venom Shortness Of Breath      Outpatient Encounter  Prescriptions as of 08/27/2017  Medication Sig  . lactobacillus acidophilus (BACID) TABS tablet Take 2 tablets by mouth 2 (two) times daily.   . Ledipasvir-Sofosbuvir (HARVONI) 90-400 MG TABS Take 1 tablet by mouth daily.  Marland Kitchen METHADONE HCL PO Take 1 tablet by mouth daily.   Marland Kitchen gabapentin (NEURONTIN) 300 MG capsule Take 1 capsule (300 mg total) by mouth 3 (three) times daily. (Patient not taking: Reported on 08/27/2017)  . meloxicam (MOBIC) 15 MG tablet Take 0.5-1 tablets by mouth daily.  . varenicline (CHANTIX STARTING MONTH PAK) 0.5 MG X 11 & 1 MG X 42 tablet Take one 0.5 mg tablet by mouth once daily for 3 days, then increase to one 0.5 mg tablet twice daily for 4 days, then increase to one 1 mg tablet twice daily. (Patient not taking: Reported on 08/27/2017)  . [DISCONTINUED] ibuprofen (ADVIL,MOTRIN) 200 MG tablet Take 200 mg by mouth every 6 (six) hours as needed.  . [DISCONTINUED] varenicline (CHANTIX CONTINUING MONTH PAK) 1 MG tablet Take 1 tablet (1 mg total) by mouth 2 (two) times daily.   No facility-administered encounter medications on file as of 08/27/2017.      REVIEW OF SYSTEMS  : All other systems reviewed and negative except where noted in the History of Present Illness.   PHYSICAL EXAM: Ht 6' 0.34" (1.838 m) Comment: height measured without shoes  Wt 261 lb 6 oz (118.6 kg)   BMI 35.11 kg/m  General: Well developed white male in no acute distress Head: Normocephalic and atraumatic Eyes:  Sclerae anicteric, conjunctiva pink. Ears: Normal auditory acuity Lungs: Clear throughout to auscultation; no increased WOB. Heart: Regular rate and rhythm; no M/R/G. Abdomen: Soft, non-distended.  BS present.  Non-tender. Rectal:  Will be done at the time of colonoscopy. Musculoskeletal: Symmetrical with no gross deformities  Skin: No lesions on visible extremities Extremities: No edema  Neurological: Alert oriented x 4, grossly non-focal Psychological:  Alert and cooperative. Normal  mood and affect  ASSESSMENT AND PLAN: *Cirrhosis due to Hepatitis C infection:  Appears to be well compensated at this time with no apparent signs of complications.  Is currently being treated for his Hepatitis C.  Needs EGD for esophageal varices screening.  Will check AFP level.  Will also check iron studies as well as obtaining some other updated routine labs including CBC, BMP, hepatic function panel, PT/INR. *Rectal bleeding:  May very well be hemorrhoidal in etiology, but he also had an abnormal CT scan recently showing abnormal area in the sigmoid colon with circumferential thickening. *Chronic constipation:  Likely due to medications, Methadone and gabapentin.  Will begin Miralax once daily, can increase to BID if needed. *RUQ abdominal pain:  Has bene resolved for about 2 weeks now, but was present for about one month and was very severe at times, taking him to the ED.  CT scan and ultrasound showed no source of pain.  Unsure of the cause of this.  If it recurs then it can be investigated further.  **The risks, benefits, and alternatives to EGD and colonoscopy were discussed with the patient and he consents to proceed.    CC:  Comer, Belia Heman, MD

## 2017-08-28 LAB — AFP TUMOR MARKER: AFP-Tumor Marker: 3.2 ng/mL (ref <6.1)

## 2017-08-30 ENCOUNTER — Encounter: Payer: Self-pay | Admitting: Gastroenterology

## 2017-08-30 DIAGNOSIS — R9389 Abnormal findings on diagnostic imaging of other specified body structures: Secondary | ICD-10-CM | POA: Insufficient documentation

## 2017-08-30 DIAGNOSIS — K625 Hemorrhage of anus and rectum: Secondary | ICD-10-CM | POA: Insufficient documentation

## 2017-08-31 DIAGNOSIS — K5900 Constipation, unspecified: Secondary | ICD-10-CM | POA: Insufficient documentation

## 2017-09-11 ENCOUNTER — Ambulatory Visit: Payer: PRIVATE HEALTH INSURANCE

## 2017-09-13 ENCOUNTER — Encounter: Payer: Self-pay | Admitting: Gastroenterology

## 2017-09-18 NOTE — Progress Notes (Signed)
Reviewed and agree with documentation and assessment and plan. K. Veena Nandigam , MD   

## 2017-09-20 ENCOUNTER — Telehealth: Payer: Self-pay | Admitting: Gastroenterology

## 2017-09-20 NOTE — Telephone Encounter (Signed)
Patient will pick up sample prep kit on Monday

## 2017-09-23 ENCOUNTER — Telehealth: Payer: Self-pay | Admitting: Pharmacist Clinician (PhC)/ Clinical Pharmacy Specialist

## 2017-09-23 NOTE — Telephone Encounter (Signed)
Kenneth FearingJames got a little nauseous this AM after taking Harvoni. He asked if he can take zofran with it. Told that it would be fine.

## 2017-09-24 ENCOUNTER — Ambulatory Visit (AMBULATORY_SURGERY_CENTER): Payer: BLUE CROSS/BLUE SHIELD | Admitting: Gastroenterology

## 2017-09-24 ENCOUNTER — Encounter: Payer: Self-pay | Admitting: Gastroenterology

## 2017-09-24 VITALS — BP 122/81 | HR 72 | Temp 99.1°F | Resp 23 | Ht 72.0 in | Wt 261.0 lb

## 2017-09-24 DIAGNOSIS — K7460 Unspecified cirrhosis of liver: Secondary | ICD-10-CM | POA: Diagnosis not present

## 2017-09-24 DIAGNOSIS — R9389 Abnormal findings on diagnostic imaging of other specified body structures: Secondary | ICD-10-CM

## 2017-09-24 DIAGNOSIS — K625 Hemorrhage of anus and rectum: Secondary | ICD-10-CM

## 2017-09-24 MED ORDER — SODIUM CHLORIDE 0.9 % IV SOLN: 500.0000 mL | INTRAVENOUS | Status: DC

## 2017-09-24 NOTE — Progress Notes (Signed)
Asked pt what day he wanted to be rescheduled for colonoscopy with a more extensive prep.  Pt said he did not know his work schedule and he would have to call back to set up appointment. Pt's sister said she would help remind pt about setting up this appointment. He asked for a work note, note was placed in an envelope and given to pt's sister.  No other complaints noted while in the recovery room. maw

## 2017-09-24 NOTE — Op Note (Signed)
[facility] Patient Name: Kenneth Brooks Procedure Date: 09/24/2017 2:50 PM MRN: [id number] Endoscopist: Napoleon Form , MD Age: 41 Referring MD:  Date of Birth: 1976-01-14 Gender: Male Account #: [id number] Procedure:                Upper GI endoscopy Indications:              To evaluate esophageal varices in patient with                            suspected portal hypertension Medicines:                Monitored Anesthesia Care Procedure:                Pre-Anesthesia Assessment:                           - Prior to the procedure, a History and Physical                            was performed, and patient medications and                            allergies were reviewed. The patient's tolerance of                            previous anesthesia was also reviewed. The risks                            and benefits of the procedure and the sedation                            options and risks were discussed with the patient.                            All questions were answered, and informed consent                            was obtained. Prior Anticoagulants: The patient has                            taken no previous anticoagulant or antiplatelet                            agents. ASA Grade Assessment: III - A patient with                            severe systemic disease. After reviewing the risks                            and benefits, the patient was deemed in                            satisfactory condition to undergo the procedure.  After obtaining informed consent, the endoscope was                            passed under direct vision. Throughout the                            procedure, the patient's blood pressure, pulse, and                            oxygen saturations were monitored continuously. The                            Endoscope was introduced through the mouth, and                            advanced to the second  part of duodenum. The upper                            GI endoscopy was accomplished without difficulty.                            The patient tolerated the procedure well. Scope In: Scope Out: Findings:                 The esophagus was normal. No esophageal varices                           Mild portal hypertensive gastropathy was found in                            the entire examined stomach. No gastric varices                           The examined duodenum was normal. Complications:            No immediate complications. Estimated Blood Loss:     Estimated blood loss: none. Impression:               - Normal esophagus.                           - Portal hypertensive gastropathy.                           - Normal examined duodenum.                           - No specimens collected. Recommendation:           - Patient has a contact number available for                            emergencies. The signs and symptoms of potential                            delayed complications were discussed with the  patient. Return to normal activities tomorrow.                            Written discharge instructions were provided to the                            patient.                           - Resume previous diet.                           - Continue present medications.                           - No aspirin, ibuprofen, naproxen, or other                            non-steroidal anti-inflammatory drugs.                           - Repeat upper endoscopy in 2 years for screening                            purposes.                           - Return to liver clinic as previously scheduled. Napoleon Form, MD 09/24/2017 3:06:56 PM This report has been signed electronically.

## 2017-09-24 NOTE — Op Note (Signed)
[facility] Patient Name: Kenneth Brooks Procedure Date: 09/24/2017 2:49 PM MRN: [id number] Endoscopist: Napoleon Form , MD Age: 41 Referring MD:  Date of Birth: September 28, 1976 Gender: Male Account #: [id number] Procedure:                Colonoscopy Indications:              Evaluation of unexplained GI bleeding, Abnormal CT                            of the GI tract Medicines:                Monitored Anesthesia Care Procedure:                Pre-Anesthesia Assessment:                           - Prior to the procedure, a History and Physical                            was performed, and patient medications and                            allergies were reviewed. The patient's tolerance of                            previous anesthesia was also reviewed. The risks                            and benefits of the procedure and the sedation                            options and risks were discussed with the patient.                            All questions were answered, and informed consent                            was obtained. Prior Anticoagulants: The patient has                            taken no previous anticoagulant or antiplatelet                            agents. ASA Grade Assessment: III - A patient with                            severe systemic disease. After reviewing the risks                            and benefits, the patient was deemed in                            satisfactory condition to undergo the procedure.  After obtaining informed consent, the colonoscope                            was passed under direct vision. Throughout the                            procedure, the patient's blood pressure, pulse, and                            oxygen saturations were monitored continuously. The                            Colonoscope was introduced through the anus and                            advanced to the the transverse colon.  The                            colonoscopy was technically difficult and complex                            due to poor bowel prep with stool present. The                            patient tolerated the procedure well. The quality                            of the bowel preparation was poor. The rectum was                            photographed. Scope In: 3:00:34 PM Scope Out: 3:03:20 PM Total Procedure Duration: 0 hours 2 minutes 46 seconds  Findings:                 The perianal and digital rectal examinations were                            normal.                           A moderate amount of semi-liquid semi-solid stool                            was found in the entire colon, precluding                            visualization. Complications:            No immediate complications. Estimated Blood Loss:     Estimated blood loss: none. Impression:               - Preparation of the colon was poor.                           - Stool in the entire examined colon.                           -  No specimens collected. Recommendation:           - Patient has a contact number available for                            emergencies. The signs and symptoms of potential                            delayed complications were discussed with the                            patient. Return to normal activities tomorrow.                            Written discharge instructions were provided to the                            patient.                           - Resume previous diet.                           - Continue present medications.                           - Repeat colonoscopy at the next available                            appointment because the bowel preparation was poor. Napoleon Form, MD 09/24/2017 3:09:57 PM This report has been signed electronically.

## 2017-09-24 NOTE — Progress Notes (Signed)
Report to PACU, RN, vss, BBS= Clear.  

## 2017-09-24 NOTE — Patient Instructions (Signed)
YOU HAD AN ENDOSCOPIC PROCEDURE TODAY AT THE [facility]:   Refer to the procedure report that was given to you for any specific questions about what was found during the examination.  If the procedure report does not answer your questions, please call your gastroenterologist to clarify.  If you requested that your care partner not be given the details of your procedure findings, then the procedure report has been included in a sealed envelope for you to review at your convenience later.  YOU SHOULD EXPECT: Some feelings of bloating in the abdomen. Passage of more gas than usual.  Walking can help get rid of the air that was put into your GI tract during the procedure and reduce the bloating. If you had a lower endoscopy (such as a colonoscopy or flexible sigmoidoscopy) you may notice spotting of blood in your stool or on the toilet paper. If you underwent a bowel prep for your procedure, you may not have a normal bowel movement for a few days.  Please Note:  You might notice some irritation and congestion in your nose or some drainage.  This is from the oxygen used during your procedure.  There is no need for concern and it should clear up in a day or so.  SYMPTOMS TO REPORT IMMEDIATELY:   Following lower endoscopy (colonoscopy or flexible sigmoidoscopy):  Excessive amounts of blood in the stool  Significant tenderness or worsening of abdominal pains  Swelling of the abdomen that is new, acute  Fever of 100F or higher   Following upper endoscopy (EGD)  Vomiting of blood or coffee ground material  New chest pain or pain under the shoulder blades  Painful or persistently difficult swallowing  New shortness of breath  Fever of 100F or higher  Black, tarry-looking stools  For urgent or emergent issues, a gastroenterologist can be reached at any hour by calling [phone].   DIET:  We do recommend a small meal at first, but then you may proceed to your regular diet.  Drink  plenty of fluids but you should avoid alcoholic beverages for 24 hours.  ACTIVITY:  You should plan to take it easy for the rest of today and you should NOT DRIVE or use heavy machinery until tomorrow (because of the sedation medicines used during the test).    FOLLOW UP: Our staff will call the number listed on your records the next business day following your procedure to check on you and address any questions or concerns that you may have regarding the information given to you following your procedure. If we do not reach you, we will leave a message.  However, if you are feeling well and you are not experiencing any problems, there is no need to return our call.  We will assume that you have returned to your regular daily activities without incident.  If any biopsies were taken you will be contacted by phone or by letter within the next 1-3 weeks.  Please call us at [phone] if you have not heard about the biopsies in 3 weeks.    SIGNATURES/CONFIDENTIALITY: You and/or your care partner have signed paperwork which will be entered into your electronic medical record.  These signatures attest to the fact that that the information above on your After Visit Summary has been reviewed and is understood.  Full responsibility of the confidentiality of this discharge information lies with you and/or your care-partner.   NO ASPIRIN, ASPIRIN CONTAINING PRODUCTS (BC OR GOODY POWDERS) OR  NSAIDS (IBUPROFEN, ADVIL, ALEVE, AND MOTRIN) . Repeat colonoscopy at the next date due to poor prep.  Patient said he will call back to make the appointment.  Patient will need extensive prep. Repeat Upper Endoscopy in 2 years for screening purposes. Return to liver clinic as previously scheduled. You may resume your current medications today. Please call if any questions or concerns.

## 2017-09-25 ENCOUNTER — Telehealth: Payer: Self-pay | Admitting: *Deleted

## 2017-09-25 ENCOUNTER — Telehealth: Payer: Self-pay

## 2017-09-25 NOTE — Telephone Encounter (Signed)
Attempted to reach patient for post-procedure f/u call. No answer. Left message that we will attempt to reach him again later today and for him to please not hesitate to call us if he has any questions/concerns regarding his care. 

## 2017-09-25 NOTE — Telephone Encounter (Signed)
Patient returned call and said he is doing good since his procedure

## 2017-09-25 NOTE — Telephone Encounter (Signed)
  Follow up Call-  Call back number 09/24/2017  Post procedure Call Back phone  # [phone]  Permission to leave phone message Yes  Some recent data might be hidden     Patient questions:  Do you have a fever, pain , or abdominal swelling? No. Pain Score  0 *  Have you tolerated food without any problems? Yes.    Have you been able to return to your normal activities? Yes.    Do you have any questions about your discharge instructions: Diet   No. Medications  No. Follow up visit  No.  Do you have questions or concerns about your Care? No.  Actions: * If pain score is 4 or above: No action needed, pain <4.

## 2017-09-26 ENCOUNTER — Ambulatory Visit: Payer: PRIVATE HEALTH INSURANCE

## 2017-10-01 ENCOUNTER — Other Ambulatory Visit: Payer: Self-pay | Admitting: Pharmacist

## 2017-10-01 ENCOUNTER — Other Ambulatory Visit: Payer: Self-pay | Admitting: Internal Medicine

## 2017-10-07 ENCOUNTER — Ambulatory Visit (INDEPENDENT_AMBULATORY_CARE_PROVIDER_SITE_OTHER): Payer: BLUE CROSS/BLUE SHIELD | Admitting: Pharmacist

## 2017-10-07 DIAGNOSIS — B182 Chronic viral hepatitis C: Secondary | ICD-10-CM | POA: Diagnosis not present

## 2017-10-07 NOTE — Progress Notes (Signed)
HPI: Kenneth Brooks is a 41 y.o. male who presents to the [facility] pharmacy clinic for Hep C follow-up.  He has genotype 1a, F3/F4 fibrosis, and started 12 weeks of Harvoni on 9/13.   Lab Results  Component Value Date   HCVGENOTYPE 1a 07/31/2017    Allergies: Allergies  Allergen Reactions  . Bee Venom Shortness Of Breath    Past Medical History: Past Medical History:  Diagnosis Date  . Anemia   . Anxiety   . Cirrhosis (HCC)   . Depression   . Hepatitis C   . Meningitis    spinal  . Neuromuscular disorder (HCC)    siactic nerve probleme  . Panic attack   . Substance abuse [facility])    former heroin user    Social History: Social History   Socioeconomic History  . Marital status: Divorced    Spouse name: Not on file  . Number of children: 1  . Years of education: Not on file  . Highest education level: Not on file  Social Needs  . Financial resource strain: Not on file  . Food insecurity - worry: Not on file  . Food insecurity - inability: Not on file  . Transportation needs - medical: Not on file  . Transportation needs - non-medical: Not on file  Occupational History  . Occupation: Producer, television/film/video  Tobacco Use  . Smoking status: Current Every Day Smoker    Packs/day: 0.50    Types: Cigarettes    Start date: 12/04/1987  . Smokeless tobacco: Never Used  Substance and Sexual Activity  . Alcohol use: Not on file  . Drug use: No    Comment: former heroin user",off for 2 years now".  . Sexual activity: Not on file  Other Topics Concern  . Not on file  Social History Narrative  . Not on file    Labs: Hepatitis B Surface Ag (no units)  Date Value  06/28/2017 Negative    Lab Results  Component Value Date   HCVGENOTYPE 1a 07/31/2017    No flowsheet data found.  AST (U/L)  Date Value  08/27/2017 33  07/15/2017 112 (H)  07/11/2017 86 (H)   ALT (U/L)  Date Value  08/27/2017 25  07/15/2017 95 (H)  07/11/2017 78 (H)   INR  Date Value  08/27/2017 1.4  ratio (H)  07/31/2017 1.1  06/28/2017 1.2    CrCl: CrCl cannot be calculated (Patient's most recent lab result is older than the maximum 21 days allowed.).  Fibrosis Score: F3/F4 as assessed by [facility]   Child-Pugh Score: A  Previous Treatment Regimen: None  Assessment: Kenneth Brooks is here today to follow-up for his Hep C infection.  He started 12 weeks of Harvoni around 9/13.  He has not missed any doses of his medication. He usually takes it in the morning but took it late one day due to scheduling issues with work. He is also on methadone.  He had some nausea when he first started taking it for about a week but it has since resolved. [facility] in [place] was late to mail his 3rd month of Harvoni, so they drove it to his house to make sure he didn't miss a dose. I will get a HCV RNA today and make an appointment for him to see Dr. Luciana Axe in ~3 months after he is done with therapy.  He can come and see Kenneth Brooks at his 6 month cure visit. He would like for me to call him with his  lab results.    Plans: - Continue Harvoni x 12 weeks - HCV RNA today - [facility] at 845am  Raymie Trani L. Sanaia Jasso, PharmD, CPP Infectious Diseases Clinical Pharmacist [facility] 10/07/2017, 3:44 PM

## 2017-10-09 LAB — HEPATITIS C RNA QUANTITATIVE
HCV Quantitative Log: <1.18 Log IU/mL
HCV RNA, PCR, QN: <15 IU/mL

## 2017-10-10 ENCOUNTER — Telehealth: Payer: Self-pay | Admitting: Pharmacist

## 2017-10-10 NOTE — Telephone Encounter (Signed)
Called Kenneth Brooks to let him know that his HCV RNA was already undetectable. Made sure he knew to continue the whole course and not miss any doses.  He verbalized understanding.

## 2017-12-06 IMAGING — DX DG LUMBAR SPINE 2-3V
3 series · 3 of 3 positions shown · non-contrast
Comparison: None.

CLINICAL DATA: Chronic low back pain

EXAM:
LUMBAR SPINE - 2-3 VIEW

[l-spine ap]
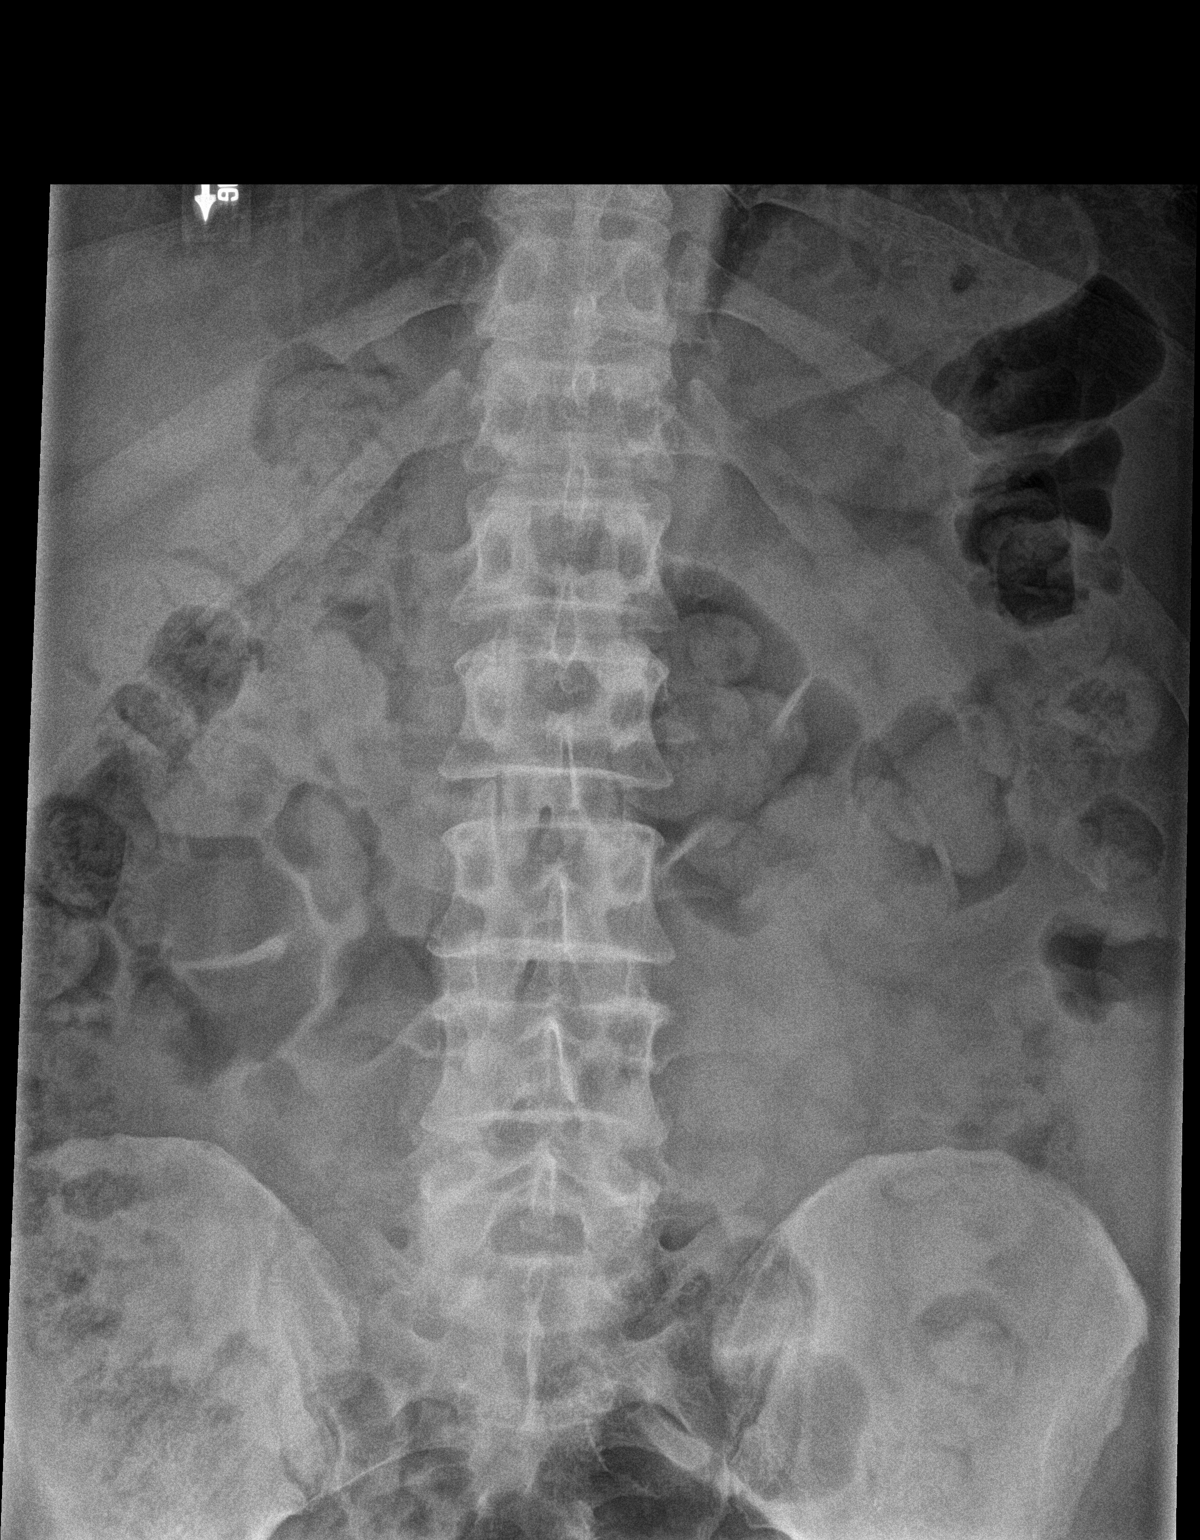

[l-spine lat]
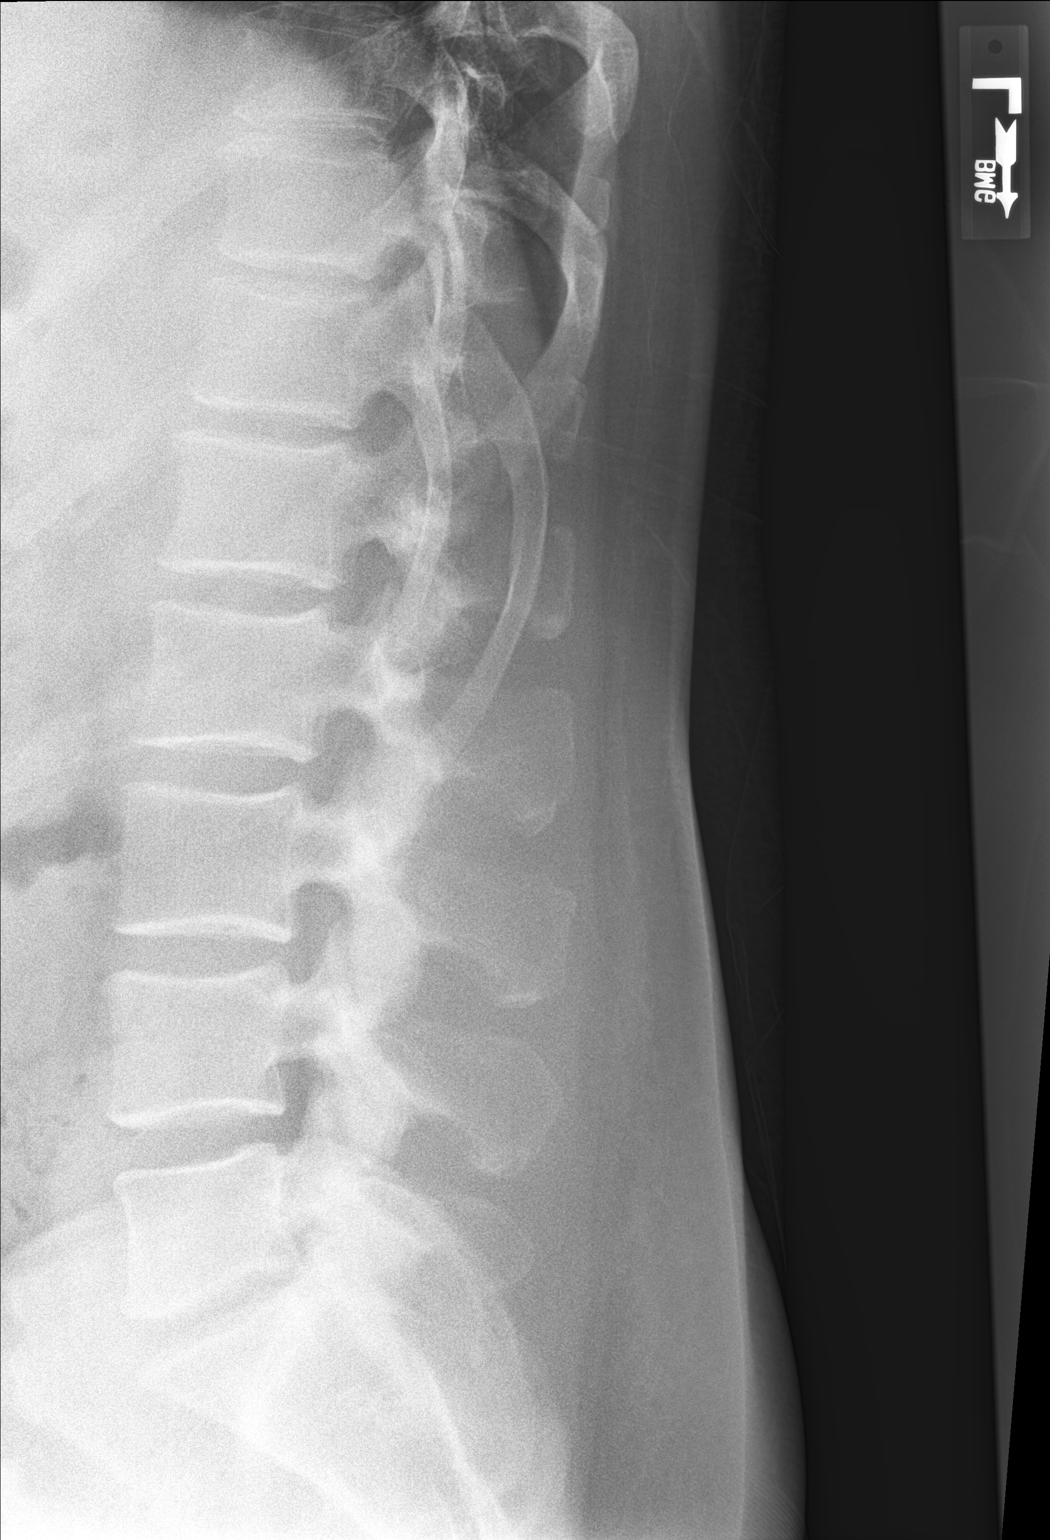

[l-spine l5-s1]
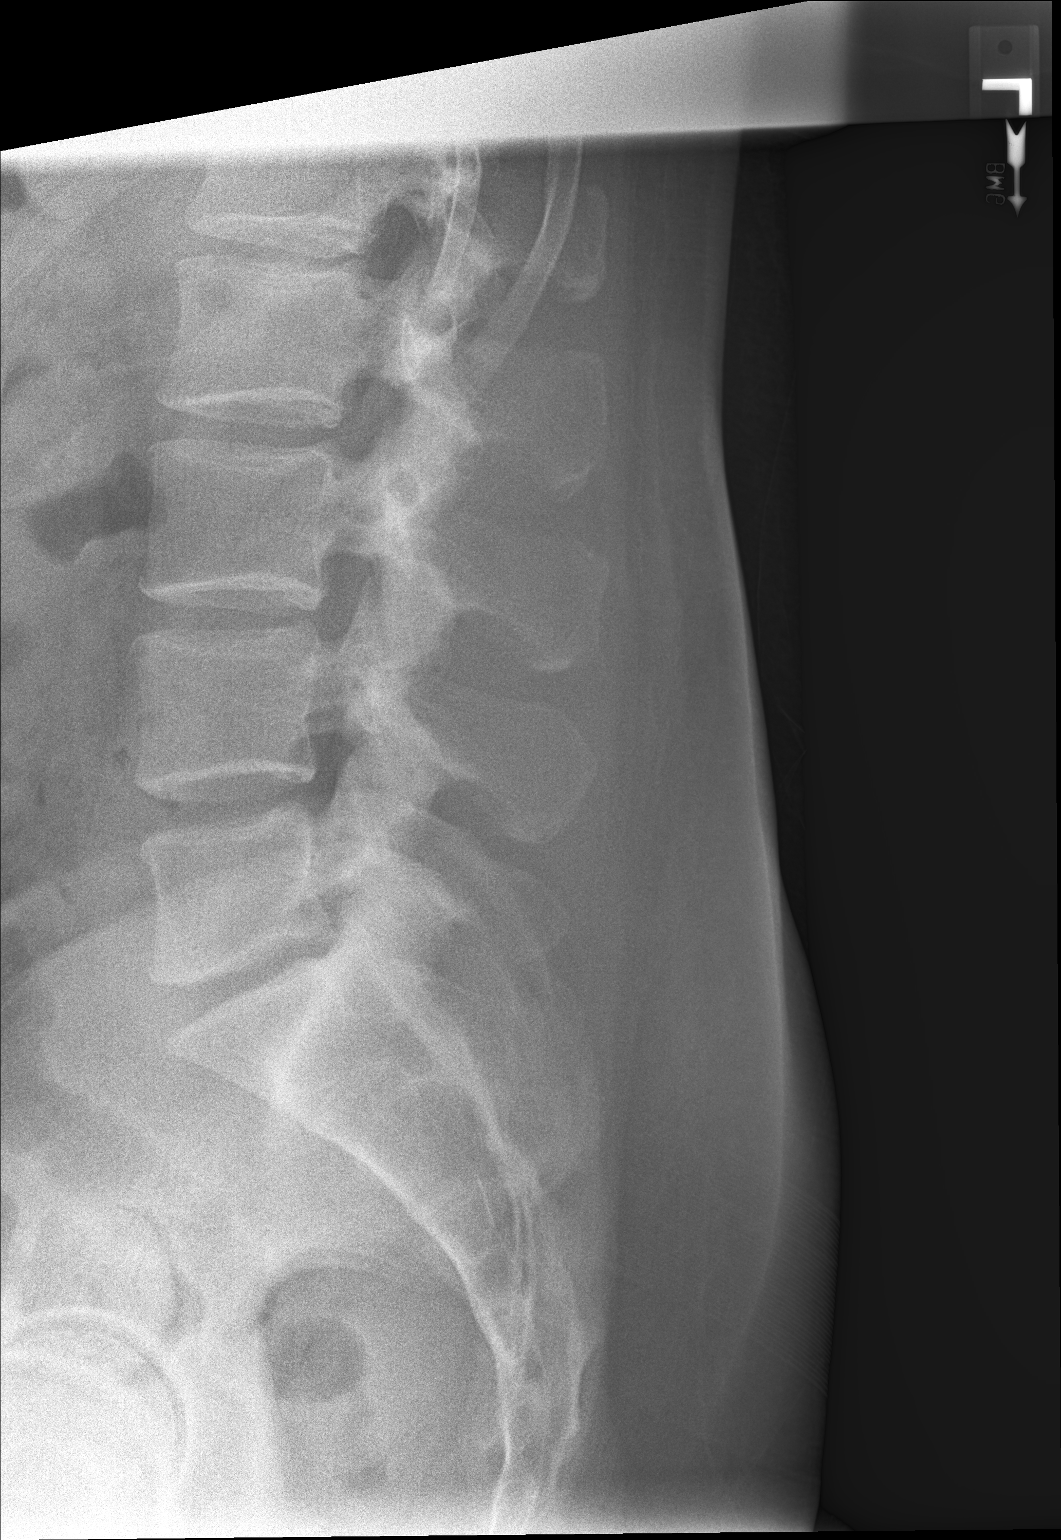

[3 of 3 positions shown; findings below may reference images not displayed]

FINDINGS: Five lumbar type vertebral bodies are well visualized. Vertebral
body height is well maintained. Mild disc space narrowing is noted
at L5-S1. Fecal material is noted throughout the colon consistent
with a degree of constipation. No other focal abnormality is noted.
IMPRESSION: Mild degenerative changes of the lower lumbar spine.

Changes of constipation.

## 2017-12-06 IMAGING — DX DG CHEST 2V
2 series · 2 of 2 positions shown · non-contrast
Comparison: None.

CLINICAL DATA: 40-year-old male with a history of cough

EXAM:
CHEST  2 VIEW

[chest pa]
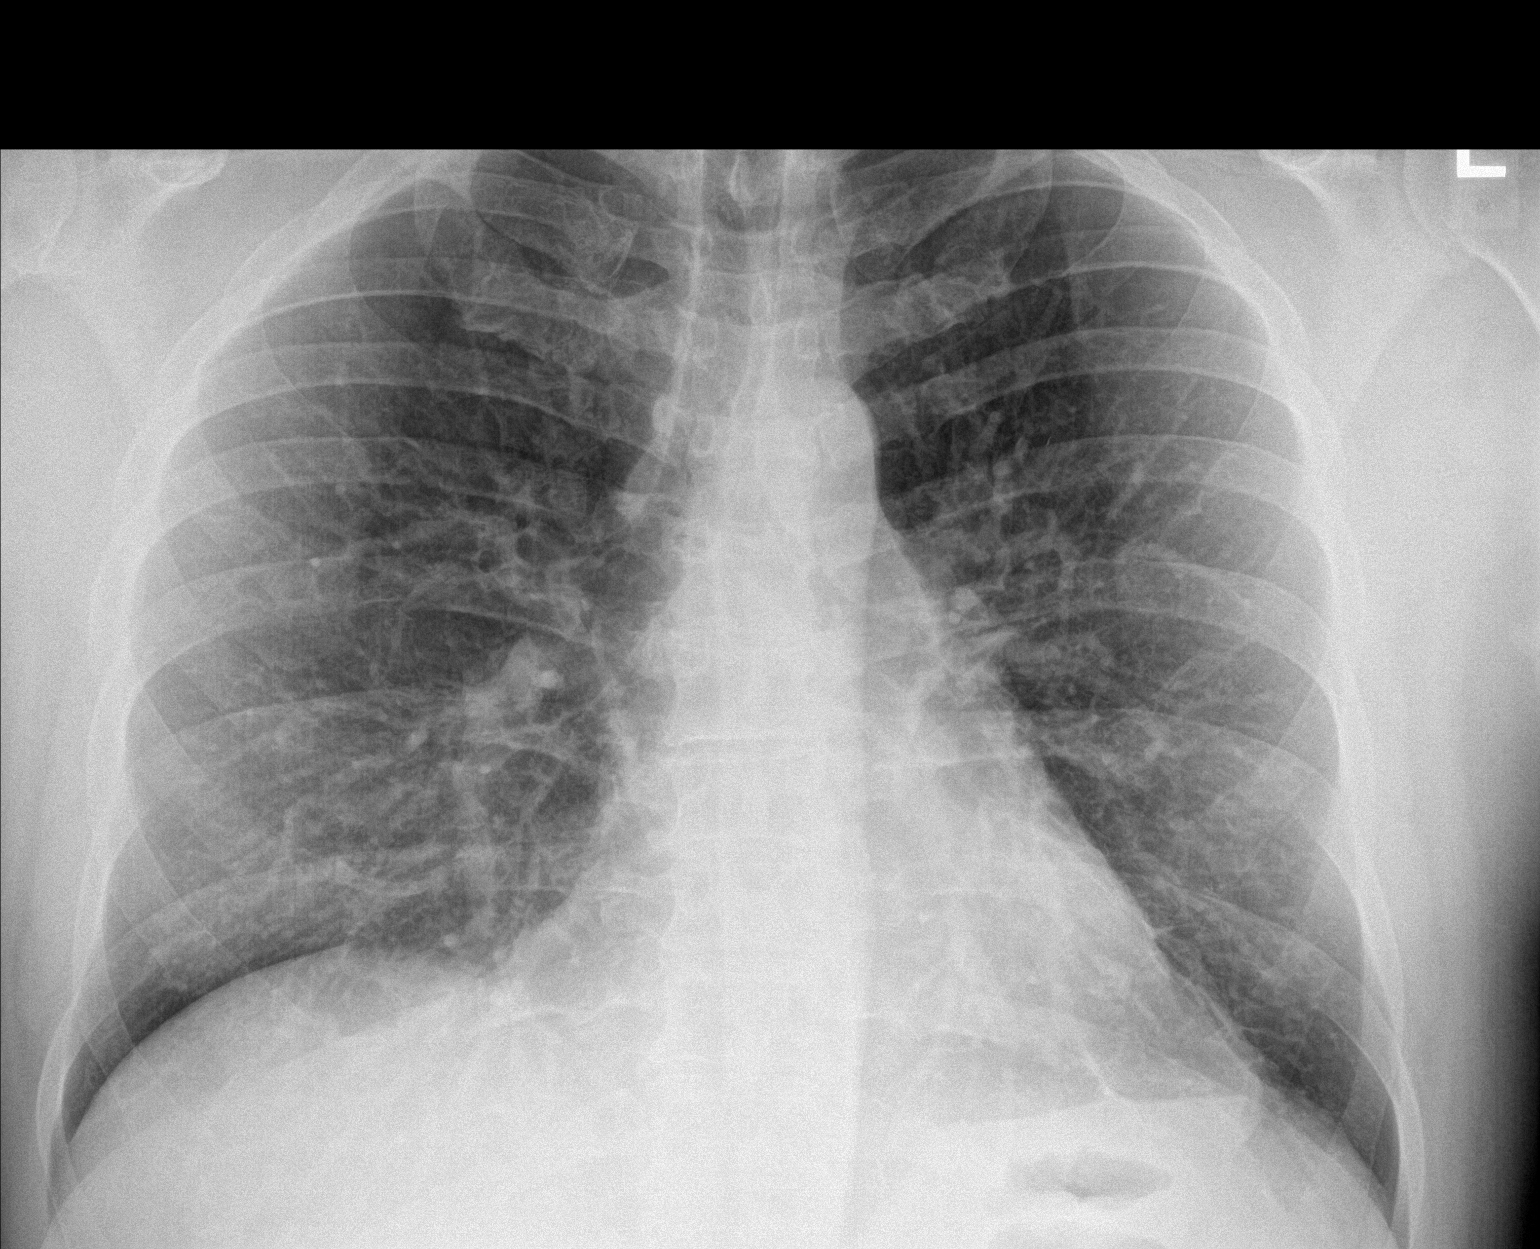

[chest lat]
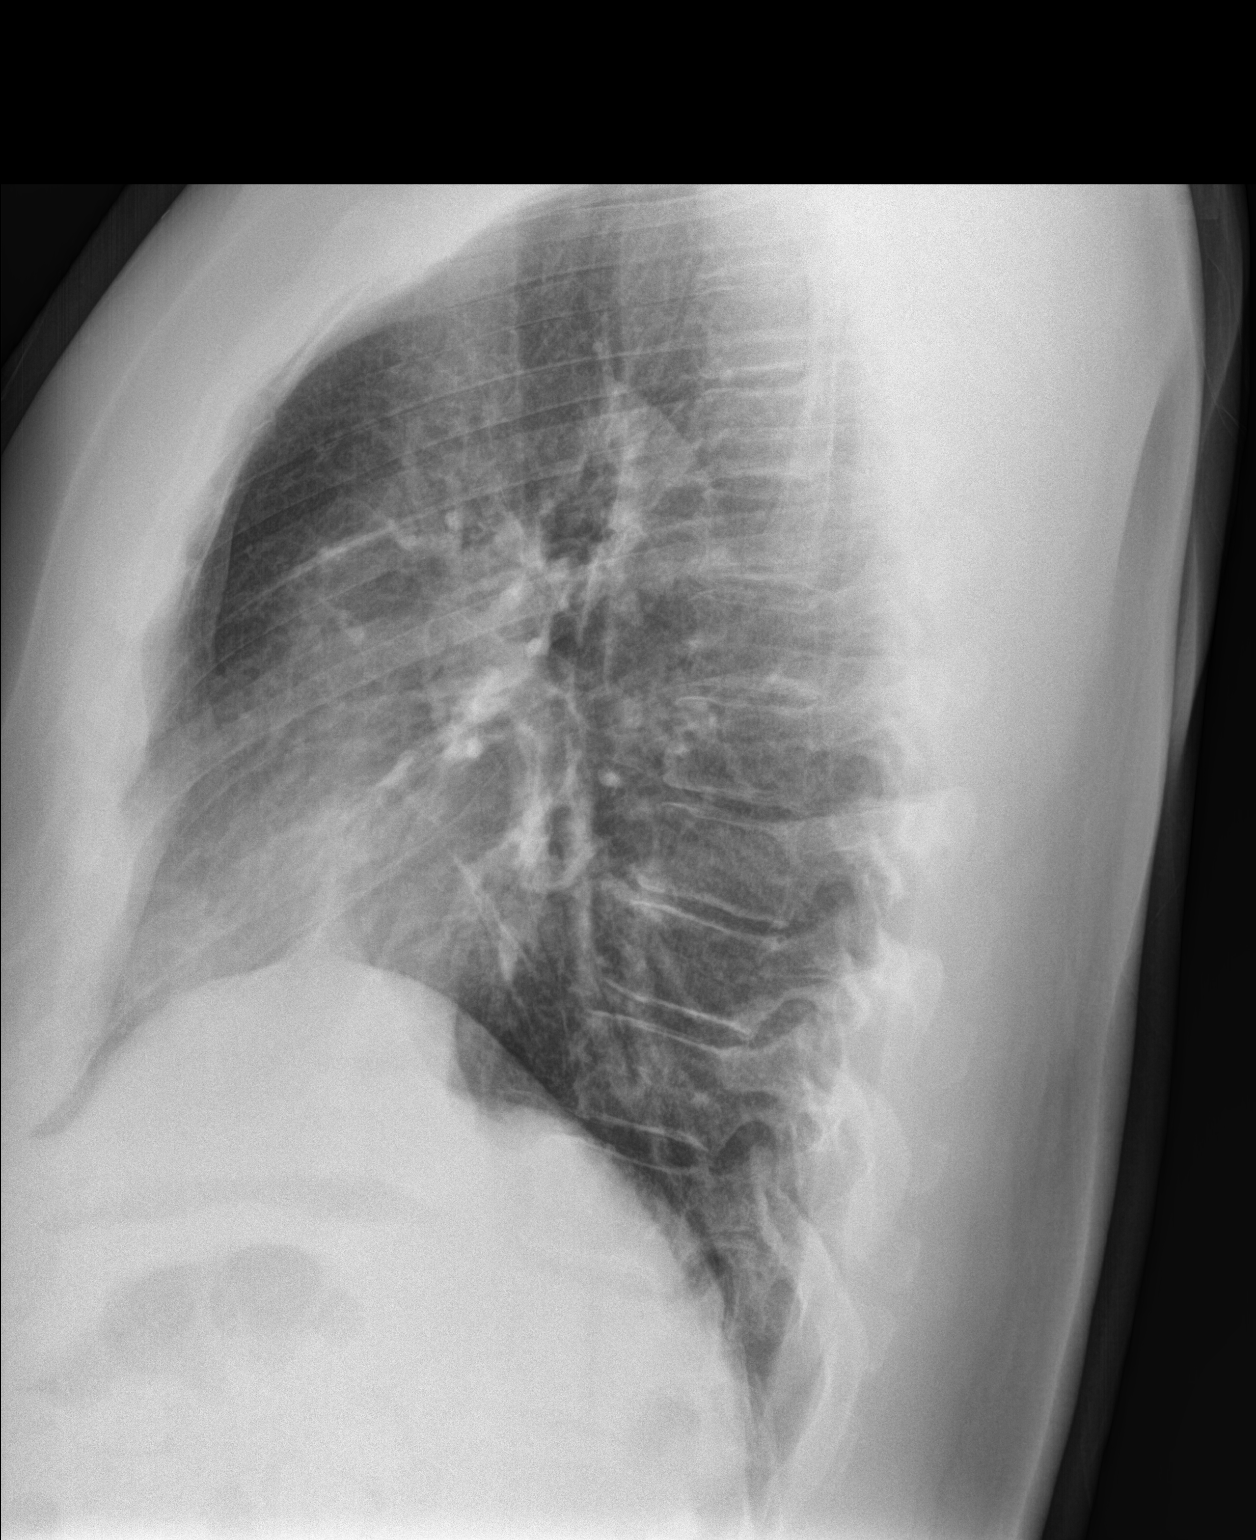

[2 of 2 positions shown; findings below may reference images not displayed]

FINDINGS: Cardiomediastinal silhouette within normal limits. No evidence of
central vascular congestion.

No interlobular septal thickening.

Coarsened interstitial markings without comparison. No pneumothorax,
pleural effusion, or confluent airspace disease. No displaced
fracture
IMPRESSION: Likely chronic lung changes without evidence of superimposed acute
cardiopulmonary disease

## 2018-01-03 ENCOUNTER — Ambulatory Visit: Payer: BLUE CROSS/BLUE SHIELD | Admitting: Physician Assistant

## 2018-01-03 ENCOUNTER — Encounter: Payer: Self-pay | Admitting: Physician Assistant

## 2018-01-03 ENCOUNTER — Other Ambulatory Visit: Payer: Self-pay

## 2018-01-03 VITALS — BP 127/82 | HR 75 | Temp 98.5°F | Resp 12 | Ht 72.0 in | Wt 270.0 lb

## 2018-01-03 DIAGNOSIS — Z0000 Encounter for general adult medical examination without abnormal findings: Secondary | ICD-10-CM | POA: Diagnosis not present

## 2018-01-03 DIAGNOSIS — Z1321 Encounter for screening for nutritional disorder: Secondary | ICD-10-CM | POA: Diagnosis not present

## 2018-01-03 DIAGNOSIS — Z130 Encounter for screening for diseases of the blood and blood-forming organs and certain disorders involving the immune mechanism: Secondary | ICD-10-CM

## 2018-01-03 DIAGNOSIS — Z1329 Encounter for screening for other suspected endocrine disorder: Secondary | ICD-10-CM | POA: Diagnosis not present

## 2018-01-03 DIAGNOSIS — Z13228 Encounter for screening for other metabolic disorders: Secondary | ICD-10-CM

## 2018-01-03 NOTE — Patient Instructions (Signed)
     IF you received an x-ray today, you will receive an invoice from [facility]. Please contact [facility] at [phone] with questions or concerns regarding your invoice.   IF you received labwork today, you will receive an invoice from [facility]. Please contact [facility] at [phone] with questions or concerns regarding your invoice.   Our billing staff will not be able to assist you with questions regarding bills from these companies.  You will be contacted with the lab results as soon as they are available. The fastest way to get your results is to activate your My Chart account. Instructions are located on the last page of this paperwork. If you have not heard from us regarding the results in 2 weeks, please contact this office.     

## 2018-01-03 NOTE — Progress Notes (Signed)
01/03/2018 2:11 PM   DOB: [birth year] / MRN: [id number]  SUBJECTIVE:  Kenneth Brooks is a 42 y.o. male presenting for annual exam.  He has a long history of drug abuse and has been clean now for about 2 years. He has a history of Hep C and this is now in remission.  He worries about his heart.  He wonders if he should see a cardiologist.  He tells me he has never had a heart attack that he knows of.  He has never had endocarditis that he knows of.  He is able to go on about his daily activities without having to stop for any shortness of breath.  He denies any angina like symptoms.  He denies diaphoresis and dizziness.  He has had several images of his chest and his heart size is always been normal.  His last EKG was completely normal in my opinion.  He does smoke and has tried to quit in the past with patches and Chantix but was unable to stop.  He knows that he needs to quit.  He is allergic to bee venom.   He  has a past medical history of Anemia, Anxiety, Cirrhosis (HCC), Depression, Hepatitis C, Meningitis, Neuromuscular disorder (HCC), Panic attack, and Substance abuse (HCC).    He  reports that he has been smoking cigarettes.  He started smoking about 30 years ago. He has been smoking about 0.50 packs per day. he has never used smokeless tobacco. He reports that he does not use drugs. He  has no sexual activity history on file. The patient  has a past surgical history that includes Spinal Tap and Wisdom tooth extraction.  His family history includes Cancer in his maternal grandmother; Cirrhosis in his mother; Crohn's disease in his brother; Heart attack in his father; Hypertension in his father; Liver cancer in his mother; Stroke (age of onset: 61) in his father.  Review of Systems  Constitutional: Negative for chills, diaphoresis and fever.  Eyes: Negative.   Respiratory: Negative for cough, hemoptysis, sputum production, shortness of breath and wheezing.   Cardiovascular: Negative for  chest pain, orthopnea and leg swelling.  Gastrointestinal: Negative for abdominal pain, blood in stool, constipation, diarrhea, heartburn, melena, nausea and vomiting.  Genitourinary: Negative for dysuria, flank pain, frequency, hematuria and urgency.  Skin: Negative for rash.  Neurological: Negative for dizziness, sensory change, speech change, focal weakness and headaches.    The problem list and medications were reviewed and updated by myself where necessary and exist elsewhere in the encounter.   OBJECTIVE:  BP 127/82   Pulse 75   Temp 98.5 F (36.9 C)   Resp 12   Ht 6' (1.829 m)   Wt 270 lb (122.5 kg)   SpO2 96%   BMI 36.62 kg/m   BP Readings from Last 3 Encounters:  01/03/18 127/82  09/24/17 122/81  08/27/17 108/66   Pulse Readings from Last 3 Encounters:  01/03/18 75  09/24/17 72  08/27/17 72   Wt Readings from Last 3 Encounters:  01/03/18 270 lb (122.5 kg)  09/24/17 261 lb (118.4 kg)  08/27/17 261 lb 6 oz (118.6 kg)     Physical Exam  Constitutional: He appears well-developed. He is active and cooperative.  Non-toxic appearance.  Cardiovascular: Normal rate, regular rhythm, S1 normal, S2 normal, normal heart sounds, intact distal pulses and normal pulses. Exam reveals no gallop and no friction rub.  No murmur heard. Pulmonary/Chest: Effort normal and breath sounds normal.  No stridor. No tachypnea. No respiratory distress. He has no wheezes. He has no rales. He exhibits no tenderness.  Abdominal: He exhibits no distension.  Musculoskeletal: He exhibits no edema.  Neurological: He is alert.  Skin: Skin is warm and dry. He is not diaphoretic. No pallor.  Vitals reviewed.   No results found for this or any previous visit (from the past 72 hour(s)).  No results found.  ASSESSMENT AND PLAN:  Kenneth Brooks was seen today for nasal congestion.  Diagnoses and all orders for this visit:  Annual physical exam: Patient with long history of drug abuse now clean for  about 2 years.  History of hepatitis C now in remission.  He is concerned about his heart. EKG NSR with a left axis.  This is not new.  Will screen his labs.  I don't think he needs to see cardiology.   Screening for endocrine, nutritional, metabolic and immunity disorder -     EKG 12-Lead -     CBC -     Lipid panel -     TSH -     Hemoglobin A1c -     Basic metabolic panel -     Hepatic function panel    The patient is advised to call or return to clinic if he does not see an improvement in symptoms, or to seek the care of the closest emergency department if he worsens with the above plan.   Kenneth Brooks, MHS, PA-C [facility] 01/03/2018 2:11 PM

## 2018-01-05 LAB — BASIC METABOLIC PANEL
BUN/Creatinine Ratio: 10 (ref 9–20)
BUN: 8 mg/dL (ref 6–24)
CO2: 22 mmol/L (ref 20–29)
CREATININE: 0.82 mg/dL (ref 0.76–1.27)
Calcium: 9.1 mg/dL (ref 8.7–10.2)
Chloride: 102 mmol/L (ref 96–106)
GFR calc Af Amer: 128 mL/min/{1.73_m2} (ref >59)
GFR, EST NON AFRICAN AMERICAN: 111 mL/min/{1.73_m2} (ref >59)
Glucose: 104 mg/dL — ABNORMAL HIGH (ref 65–99)
Potassium: 4.4 mmol/L (ref 3.5–5.2)
Sodium: 138 mmol/L (ref 134–144)

## 2018-01-05 LAB — CBC
Hematocrit: 45.8 % (ref 37.5–51.0)
Hemoglobin: 15.6 g/dL (ref 13.0–17.7)
MCH: 30.5 pg (ref 26.6–33.0)
MCHC: 34.1 g/dL (ref 31.5–35.7)
MCV: 90 fL (ref 79–97)
PLATELETS: 200 10*3/uL (ref 150–379)
RBC: 5.12 x10E6/uL (ref 4.14–5.80)
RDW: 13.1 % (ref 12.3–15.4)
WBC: 7.7 10*3/uL (ref 3.4–10.8)

## 2018-01-05 LAB — LIPID PANEL
Chol/HDL Ratio: 4.2 ratio (ref 0.0–5.0)
Cholesterol, Total: 137 mg/dL (ref 100–199)
HDL: 33 mg/dL — AB (ref >39)
LDL Calculated: 80 mg/dL (ref 0–99)
Triglycerides: 122 mg/dL (ref 0–149)
VLDL CHOLESTEROL CAL: 24 mg/dL (ref 5–40)

## 2018-01-05 LAB — HEPATIC FUNCTION PANEL
ALBUMIN: 4 g/dL (ref 3.5–5.5)
ALT: 29 IU/L (ref 0–44)
AST: 35 IU/L (ref 0–40)
Alkaline Phosphatase: 147 IU/L — ABNORMAL HIGH (ref 39–117)
BILIRUBIN TOTAL: 0.5 mg/dL (ref 0.0–1.2)
Bilirubin, Direct: 0.16 mg/dL (ref 0.00–0.40)
Total Protein: 7.9 g/dL (ref 6.0–8.5)

## 2018-01-05 LAB — HEMOGLOBIN A1C
Est. average glucose Bld gHb Est-mCnc: 114 mg/dL
Hgb A1c MFr Bld: 5.6 % (ref 4.8–5.6)

## 2018-01-05 LAB — TSH: TSH: 1.15 u[IU]/mL (ref 0.450–4.500)

## 2018-01-06 NOTE — Progress Notes (Signed)
His labs are back and his ASCVD score is good at 3.1%.  This would be decreased by half if he would stop smoking.  I do not think he would benefit from seeing cardiology.  Please call him because he worries about his heart.

## 2018-01-28 ENCOUNTER — Ambulatory Visit (INDEPENDENT_AMBULATORY_CARE_PROVIDER_SITE_OTHER): Payer: BLUE CROSS/BLUE SHIELD | Admitting: Internal Medicine

## 2018-01-28 ENCOUNTER — Encounter: Payer: Self-pay | Admitting: Internal Medicine

## 2018-01-28 VITALS — BP 146/89 | HR 87 | Temp 98.6°F | Ht 72.0 in | Wt 270.0 lb

## 2018-01-28 DIAGNOSIS — K717 Toxic liver disease with fibrosis and cirrhosis of liver: Secondary | ICD-10-CM | POA: Diagnosis not present

## 2018-01-28 DIAGNOSIS — B182 Chronic viral hepatitis C: Secondary | ICD-10-CM | POA: Diagnosis not present

## 2018-01-28 DIAGNOSIS — F1910 Other psychoactive substance abuse, uncomplicated: Secondary | ICD-10-CM | POA: Insufficient documentation

## 2018-01-28 LAB — COMPLETE METABOLIC PANEL WITH GFR
AG Ratio: 1.1 (calc) (ref 1.0–2.5)
ALBUMIN MSPROF: 4 g/dL (ref 3.6–5.1)
ALKALINE PHOSPHATASE (APISO): 125 U/L — AB (ref 40–115)
ALT: 25 U/L (ref 9–46)
AST: 33 U/L (ref 10–40)
BILIRUBIN TOTAL: 0.5 mg/dL (ref 0.2–1.2)
BUN: 10 mg/dL (ref 7–25)
CO2: 26 mmol/L (ref 20–32)
Calcium: 9.3 mg/dL (ref 8.6–10.3)
Chloride: 104 mmol/L (ref 98–110)
Creat: 0.9 mg/dL (ref 0.60–1.35)
GFR, Est African American: 123 mL/min/{1.73_m2} (ref >=60)
GFR, Est Non African American: 106 mL/min/{1.73_m2} (ref >=60)
GLUCOSE: 92 mg/dL (ref 65–99)
Globulin: 3.6 g/dL (calc) (ref 1.9–3.7)
Potassium: 4.1 mmol/L (ref 3.5–5.3)
SODIUM: 138 mmol/L (ref 135–146)
Total Protein: 7.6 g/dL (ref 6.1–8.1)

## 2018-01-28 NOTE — Assessment & Plan Note (Signed)
HCC screening ultrasound now and in 6 months.  rtc 6 months after lab and ultrasound then can do yearly follow up with ultrasound every 6 months.

## 2018-01-28 NOTE — Assessment & Plan Note (Signed)
Doing well and treatment completed.  EOT lab today and he can return in 6 months for SVR 24.

## 2018-01-28 NOTE — Progress Notes (Signed)
   Subjective:    Patient ID: Kenneth Brooks, male    DOB: 1976/11/30, 42 y.o.   MRN: [id number]  HPI Here for follow up of chronic hepatitis C and cirrhosis. Has completed harvoni for 12 weeks about 2 months ago.  No issues during treatment.  No associated fatigue, headaches.  Feeling well now.  No new issues.  Has seen GI and no issues on EGD.     Review of Systems  Constitutional: Negative for fatigue.  Gastrointestinal: Negative for diarrhea.  Skin: Negative for rash.       Objective:   Physical Exam  Constitutional: He appears well-developed and well-nourished. No distress.  HENT:  Mouth/Throat: No oropharyngeal exudate.  Eyes: No scleral icterus.  Cardiovascular: Normal rate, regular rhythm and normal heart sounds.  No murmur heard. Pulmonary/Chest: Effort normal and breath sounds normal. No respiratory distress.  Skin: No rash noted.    SH: no alcohol      Assessment & Plan:

## 2018-01-28 NOTE — Assessment & Plan Note (Signed)
He continues on methadone and doing well.  Knows risk of reinfection after cure.

## 2018-01-30 LAB — HEPATITIS C RNA QUANTITATIVE
HCV QUANT LOG: NOT DETECTED {Log_IU}/mL
HCV RNA, PCR, QN: NOT DETECTED [IU]/mL

## 2018-02-10 ENCOUNTER — Telehealth: Payer: Self-pay | Admitting: *Deleted

## 2018-02-10 NOTE — Telephone Encounter (Signed)
Should I reschedule the April ultrasound to August then?

## 2018-02-10 NOTE — Telephone Encounter (Signed)
Yes, he was supposed to have a lab and ultrasound done in August with follow up with me after that.   thanks

## 2018-02-10 NOTE — Telephone Encounter (Signed)
Patient calling for lab results after his office visit 2/26. RN relayed undetectable Hep C viral load, confirmed upcoming appointment for ultrasound 4/12, plan to follow up at [facility] in 6 months for 24 week cure lab. Kenneth Brooks, Kenneth Elsen M, RN

## 2018-02-11 NOTE — Telephone Encounter (Signed)
He needs an ultrasound every 6 months.  He was supposed to have one in Feb but looks like it is now April.  I would do April, then lab and ultrasound in October, with follow up with me after that in Oct.  thanks

## 2018-03-14 ENCOUNTER — Ambulatory Visit
Admission: RE | Admit: 2018-03-14 | Discharge: 2018-03-14 | Disposition: A | Discharge status: Home / Self Care | Payer: BLUE CROSS/BLUE SHIELD | Source: Ambulatory Visit | Attending: Internal Medicine | Admitting: Internal Medicine

## 2018-03-14 DIAGNOSIS — R7989 Other specified abnormal findings of blood chemistry: Secondary | ICD-10-CM | POA: Diagnosis not present

## 2018-03-14 DIAGNOSIS — B182 Chronic viral hepatitis C: Secondary | ICD-10-CM

## 2018-03-14 DIAGNOSIS — K717 Toxic liver disease with fibrosis and cirrhosis of liver: Secondary | ICD-10-CM

## 2018-07-16 ENCOUNTER — Ambulatory Visit: Payer: BLUE CROSS/BLUE SHIELD | Admitting: Physician Assistant

## 2018-08-20 NOTE — Telephone Encounter (Signed)
RN contacted patient, advised he needed to schedule his 6 month follow up.  He was at work, asked this RN to schedule it for him, leaving the appointment details on his voicemail.  RN scheduled patient for ultrasound 10/21 8am [facility] ([address]) - fasting after midnight - and follow up with Dr Luciana Axeomer 10/24 at 11.  RN gave him the phone numbers to reschedule if needed.  [phone].  Morrie Sheldonshley will follow up with prior authorization. Andree CossHowell, Tinika Bucknam M, RN

## 2018-09-01 ENCOUNTER — Other Ambulatory Visit: Payer: Self-pay | Admitting: Occupational Medicine

## 2018-09-01 ENCOUNTER — Ambulatory Visit: Payer: Self-pay

## 2018-09-01 DIAGNOSIS — M79641 Pain in right hand: Secondary | ICD-10-CM

## 2018-09-22 ENCOUNTER — Other Ambulatory Visit: Payer: BLUE CROSS/BLUE SHIELD

## 2018-09-25 ENCOUNTER — Ambulatory Visit: Payer: BLUE CROSS/BLUE SHIELD | Admitting: Internal Medicine

## 2018-10-06 ENCOUNTER — Other Ambulatory Visit: Payer: BLUE CROSS/BLUE SHIELD

## 2018-12-01 ENCOUNTER — Ambulatory Visit
Admission: RE | Admit: 2018-12-01 | Discharge: 2018-12-01 | Disposition: A | Discharge status: Home / Self Care | Payer: BLUE CROSS/BLUE SHIELD | Source: Ambulatory Visit | Attending: Internal Medicine | Admitting: Internal Medicine

## 2018-12-01 DIAGNOSIS — K717 Toxic liver disease with fibrosis and cirrhosis of liver: Secondary | ICD-10-CM

## 2018-12-01 DIAGNOSIS — B182 Chronic viral hepatitis C: Secondary | ICD-10-CM

## 2018-12-01 DIAGNOSIS — K7460 Unspecified cirrhosis of liver: Secondary | ICD-10-CM | POA: Diagnosis not present

## 2018-12-05 ENCOUNTER — Other Ambulatory Visit: Payer: BLUE CROSS/BLUE SHIELD

## 2019-01-09 ENCOUNTER — Emergency Department (HOSPITAL_COMMUNITY)
Admission: EM | Admit: 2019-01-09 | Discharge: 2019-01-09 | Disposition: A | Discharge status: Home / Self Care | Payer: PRIVATE HEALTH INSURANCE | Attending: Emergency Medicine | Admitting: Emergency Medicine

## 2019-01-09 DIAGNOSIS — R51 Headache: Secondary | ICD-10-CM | POA: Diagnosis present

## 2019-01-09 DIAGNOSIS — I10 Essential (primary) hypertension: Secondary | ICD-10-CM | POA: Insufficient documentation

## 2019-01-09 DIAGNOSIS — F17210 Nicotine dependence, cigarettes, uncomplicated: Secondary | ICD-10-CM | POA: Diagnosis not present

## 2019-01-09 DIAGNOSIS — Z79899 Other long term (current) drug therapy: Secondary | ICD-10-CM | POA: Diagnosis not present

## 2019-01-09 LAB — CBG MONITORING, ED: GLUCOSE-CAPILLARY: 116 mg/dL — AB (ref 70–99)

## 2019-01-09 MED ORDER — AMLODIPINE BESYLATE 5 MG PO TABS: 5.0000 mg | ORAL_TABLET | Freq: Every day | ORAL | 1 refills | Status: DC

## 2019-01-09 NOTE — ED Triage Notes (Signed)
Pt POV d/t L.Occiptial HA last night. Pt states he went to Methadone clinic and BP was 144/102, Pt was informed by Clinic RN to come to hospital. Pt experiencing mild anxiety

## 2019-01-09 NOTE — ED Provider Notes (Signed)
[facility] EMERGENCY DEPARTMENT Provider Note   CSN: [id number] Arrival date & time: 01/09/19  3491     History   Chief Complaint Chief Complaint  Patient presents with  . Hypertension    HPI Kenneth Brooks is a 43 y.o. male.  43 year old male presents from his methadone clinic after his blood pressure was elevated there.  Blood pressure was noted to be 150/100.  Patient states yesterday he had intermittent tingling to his occipital area with mild headache.  He denied any emesis, neurological findings.  States that the symptoms lasted for about 30 minutes and have since resolved.  Denies any prior history of hypertension.  States that he has been clean from using heroin.  No chest pain or shortness of breath.  Denies any ataxia.     Past Medical History:  Diagnosis Date  . Anemia   . Anxiety   . Cirrhosis (HCC)   . Depression   . Hepatitis C   . Meningitis    spinal  . Neuromuscular disorder (HCC)    siactic nerve probleme  . Panic attack   . Substance abuse [facility])    former heroin user    Patient Active Problem List   Diagnosis Date Noted  . Substance abuse (HCC) 01/28/2018  . Cirrhosis of liver (HCC) 07/31/2017  . Chronic hepatitis C without hepatic coma (HCC) 07/01/2017    Past Surgical History:  Procedure Laterality Date  . Spinal Tap    . WISDOM TOOTH EXTRACTION          Home Medications    Prior to Admission medications   Medication Sig Start Date End Date Taking? Authorizing Provider  gabapentin (NEURONTIN) 300 MG capsule Take 1 capsule (300 mg total) by mouth 3 (three) times daily. 06/28/17   Ofilia Neas, PA-C  METHADONE HCL PO Take 1 tablet by mouth daily.     [provider]    Family History Family History  Problem Relation Age of Onset  . Cirrhosis Mother   . Liver cancer Mother   . Hypertension Father   . Stroke Father 64  . Heart attack Father   . Crohn's disease Brother   . Cancer Maternal  Grandmother        smoker    Social History Social History   Tobacco Use  . Smoking status: Current Every Day Smoker    Packs/day: 0.50    Types: Cigarettes    Start date: 12/04/1987  . Smokeless tobacco: Never Used  Substance Use Topics  . Alcohol use: Not on file  . Drug use: No    Comment: former heroin user",off for 2 years now".     Allergies   Bee venom   Review of Systems Review of Systems  All other systems reviewed and are negative.    Physical Exam Updated Vital Signs Temp 98.4 F (36.9 C) (Oral)   Physical Exam Vitals signs and nursing note reviewed.  Constitutional:      General: He is not in acute distress.    Appearance: Normal appearance. He is well-developed. He is not toxic-appearing.  HENT:     Head: Normocephalic and atraumatic.  Eyes:     General: Lids are normal.     Conjunctiva/sclera: Conjunctivae normal.     Pupils: Pupils are equal, round, and reactive to light.  Neck:     Musculoskeletal: Normal range of motion and neck supple.     Thyroid: No thyroid mass.  Trachea: No tracheal deviation.  Cardiovascular:     Rate and Rhythm: Normal rate and regular rhythm.     Heart sounds: Normal heart sounds. No murmur. No gallop.   Pulmonary:     Effort: Pulmonary effort is normal. No respiratory distress.     Breath sounds: Normal breath sounds. No stridor. No decreased breath sounds, wheezing, rhonchi or rales.  Abdominal:     General: Bowel sounds are normal. There is no distension.     Palpations: Abdomen is soft.     Tenderness: There is no abdominal tenderness. There is no rebound.  Musculoskeletal: Normal range of motion.        General: No tenderness.  Skin:    General: Skin is warm and dry.     Findings: No abrasion or rash.  Neurological:     Mental Status: He is alert and oriented to person, place, and time.     GCS: GCS eye subscore is 4. GCS verbal subscore is 5. GCS motor subscore is 6.     Cranial Nerves: No cranial  nerve deficit.     Sensory: No sensory deficit.  Psychiatric:        Speech: Speech normal.        Behavior: Behavior normal.      ED Treatments / Results  Labs (all labs ordered are listed, but only abnormal results are displayed) Labs Reviewed - No data to display  EKG None  Radiology No results found.  Procedures Procedures (including critical care time)  Medications Ordered in ED Medications - No data to display   Initial Impression / Assessment and Plan / ED Course  I have reviewed the triage vital signs and the nursing notes.  Pertinent labs & imaging results that were available during my care of the patient were reviewed by me and considered in my medical decision making (see chart for details).     Patient's blood pressure mildly elevated here.  Will place on Norvasc.  Blood sugar taken and was 116.  He has no focal deficits at this time.  He has no headache.  He is comfortable.  No indication for imaging or blood work at this time.  Will give referral to the community wellness center  Final Clinical Impressions(s) / ED Diagnoses   Final diagnoses:  None    ED Discharge Orders    None       Lorre Nick, MD 01/09/19 [id number]

## 2019-04-28 ENCOUNTER — Telehealth: Payer: PRIVATE HEALTH INSURANCE | Admitting: Physician Assistant

## 2019-04-28 ENCOUNTER — Encounter: Payer: Self-pay | Admitting: Physician Assistant

## 2019-04-28 DIAGNOSIS — M791 Myalgia, unspecified site: Secondary | ICD-10-CM

## 2019-04-28 NOTE — Progress Notes (Signed)
E-Visit for Corona Virus Screening  Based on your current symptoms, you may very well have the virus, however your symptoms are mild. Currently, not all patients are being tested. If the symptoms are mild and there is not a known exposure, performing the test is not indicated.  You have been enrolled in MyChart Home Monitoring for COVID-19. Daily you will receive a questionnaire within the MyChart website. Our COVID-19 response team will be monitoring your responses daily.   Coronavirus disease 2019 (COVID-19)is a respiratory illness that can spread from person to person. The virus that causes COVID-19 is a new virus that was first identified in the country of [place] but is now found in multiple other countries and has spread to the [place].  Symptoms associated with the virus are mild to severe fever, cough, and shortness of breath. There is currently no vaccine to protect against COVID-19, and there is no specific antiviral treatment for the virus.   To be considered HIGH RISK for Coronavirus (COVID-19), you have to meet the following criteria:  . Traveled to [place], [place], [place], [place] or [place]; or in the [place] to [place], [place], [place], or [place]; and have fever, cough, and shortness of breath within the last 2 weeks of travel OR  . Been in close contact with a person diagnosed with COVID-19 within the last 2 weeks and have fever, cough, and shortness of breath  . IF YOU DO NOT MEET THESE CRITERIA, YOU ARE CONSIDERED LOW RISK FOR COVID-19.   It is vitally important that if you feel that you have an infection such as this virus or any other virus that you stay home and away from places where you may spread it to others.  You should self-quarantine for 14 days if you have symptoms that could potentially be coronavirus and avoid contact with people age 42 and older.    You may also take acetaminophen (Tylenol)  500 mg every 6-8 hours (this is a reduced dose given  your history of liver disease) as needed for fever.     Reduce your risk of any infection by using the same precautions used for avoiding the common cold or flu: Wash your hands often with soap and warm water for at least 20 seconds.  If soap and water are not readily available, use an alcohol-based hand sanitizer with at least 60% alcohol.  If coughing or sneezing, cover your mouth and nose by coughing or sneezing into the elbow areas of your shirt or coat, into a tissue or into your sleeve (not your hands). Avoid shaking hands with others and consider head nods or verbal greetings only.  Avoid touching your eyes,nose, or mouth with unwashed hands. Avoid close contact with people who are sick. Avoid places or events with large numbers of people in one location, like concerts or sporting events. Carefully consider travel plans you have or are making. If you are planning any travel outside or inside the [place], visit the CDC'sTravelers' Health webpagefor the latest health notices. If you have some symptoms but not all symptoms, continue to monitor at home and seek medical attention if your symptoms worsen. If you are having a medical emergency, call 911.  HOME CARE Only take medications as instructed by your medical team. Drink plenty of fluids and get plenty of rest. A steam or ultrasonic humidifier can help if you have congestion.   GET HELP RIGHT AWAY IF: You develop worsening fever. You become short of  breath You cough up blood. Your symptoms become more severe MAKE SURE YOU  Understand these instructions. Will watch your condition. Will get help right away if you are not doing well or get worse.  Your e-visit answers were reviewed by a board certified advanced clinical practitioner to complete your personal care plan.  Depending on the condition, your plan could have included both over the counter or prescription medications.  If there is a problem please reply once you have  received a response from your provider. Your safety is important to [place].  If you have drug allergies check your prescription carefully.    You can use MyChart to ask questions about today's visit, request a non-urgent call back, or ask for a work or school excuse for 24 hours related to this e-Visit. If it has been greater than 24 hours you will need to follow up with your provider, or enter a new e-Visit to address those concerns. You will get an e-mail in the next two days asking about your experience.  I hope that your e-visit has been valuable and will speed your recovery. Thank you for using e-visits.    ===View-only below this line===   ----- Message -----    From: Kenneth Brooks    Sent: 04/28/2019  8:31 AM EDT      To: E-Visit Mailing List Subject: E-Visit Submission: CoronaVirus (COVID-19) Screening  E-Visit Submission: CoronaVirus (COVID-19) Screening --------------------------------  Question: Do you have any of the following?  Answer:   Cough  Question: Do you have any of the following additional symptoms?  Answer:   Sore throat            Body aches  Question: Have you had a fever? Answer:   No  Question: Have others in your home or workplace had similar symptoms? Answer:   No  Question: When did your symptoms start? Answer:   2 days ago  Question: Have you recently visited any of the following countries? Answer:   None of these  Question: If you have traveled anywhere in the last  2 months please document where you have visited: Answer:     Question: Have you recently been around others from these countries or visited these countries who have had coughing or fever? Answer:   No  Question: Have you recently been around anyone who has been diagnosed with Corona virus? Answer:   No  Question: Have you been taking any medications? Answer:   No  Question: If taking medications for these symptoms, please list the names and whether they are helping or  not Answer:     Question: Are you treated for any of the following conditions: Asthma, COPD, Diabetes, Renal Failure (on Dialysis), AIDS, any Neuromuscular disease that effects the clearing of secretions, Heart Failure, or Heart Disease? Answer:   No  Question: Please enter a phone number where you can be reached if we have additional questions about your symptoms Answer:   [phone]  Question: Please list your medication allergies that you may have ? (If 'none' , please list as 'none') Answer:   Methadone 100mg  daily  Question: Please list any additional comments  Answer:   I need to quarantine for 2 weeks and need a letter to present my job .I work for [employer].  A total of 5-10 minutes was spent evaluating this patients questionnaire and formulating a plan of care.

## 2019-06-11 ENCOUNTER — Other Ambulatory Visit: Payer: Self-pay

## 2019-06-11 ENCOUNTER — Emergency Department (HOSPITAL_COMMUNITY): Payer: Self-pay

## 2019-06-11 ENCOUNTER — Encounter (HOSPITAL_COMMUNITY): Payer: Self-pay | Admitting: Emergency Medicine

## 2019-06-11 ENCOUNTER — Emergency Department (HOSPITAL_COMMUNITY)
Admission: EM | Admit: 2019-06-11 | Discharge: 2019-06-12 | Disposition: A | Discharge status: Home / Self Care | Payer: Self-pay | Attending: Emergency Medicine | Admitting: Emergency Medicine

## 2019-06-11 DIAGNOSIS — R519 Headache, unspecified: Secondary | ICD-10-CM

## 2019-06-11 DIAGNOSIS — I10 Essential (primary) hypertension: Secondary | ICD-10-CM

## 2019-06-11 DIAGNOSIS — R51 Headache: Secondary | ICD-10-CM | POA: Insufficient documentation

## 2019-06-11 DIAGNOSIS — Z9114 Patient's other noncompliance with medication regimen: Secondary | ICD-10-CM | POA: Insufficient documentation

## 2019-06-11 DIAGNOSIS — Z79899 Other long term (current) drug therapy: Secondary | ICD-10-CM | POA: Insufficient documentation

## 2019-06-11 DIAGNOSIS — F17210 Nicotine dependence, cigarettes, uncomplicated: Secondary | ICD-10-CM | POA: Insufficient documentation

## 2019-06-11 LAB — COMPREHENSIVE METABOLIC PANEL
ALT: 37 U/L (ref 0–44)
AST: 46 U/L — ABNORMAL HIGH (ref 15–41)
Albumin: 4.5 g/dL (ref 3.5–5.0)
Alkaline Phosphatase: 124 U/L (ref 38–126)
Anion gap: 10 (ref 5–15)
BUN: 13 mg/dL (ref 6–20)
CO2: 24 mmol/L (ref 22–32)
Calcium: 10 mg/dL (ref 8.9–10.3)
Chloride: 103 mmol/L (ref 98–111)
Creatinine, Ser: 1.09 mg/dL (ref 0.61–1.24)
GFR calc Af Amer: >60 mL/min (ref >60)
GFR calc non Af Amer: >60 mL/min (ref >60)
Glucose, Bld: 96 mg/dL (ref 70–99)
Potassium: 4.1 mmol/L (ref 3.5–5.1)
Sodium: 137 mmol/L (ref 135–145)
Total Bilirubin: 1.1 mg/dL (ref 0.3–1.2)
Total Protein: 8.6 g/dL — ABNORMAL HIGH (ref 6.5–8.1)

## 2019-06-11 LAB — CBC WITH DIFFERENTIAL/PLATELET
Abs Immature Granulocytes: 0.03 10*3/uL (ref 0.00–0.07)
Basophils Absolute: 0.1 10*3/uL (ref 0.0–0.1)
Basophils Relative: 1 %
Eosinophils Absolute: 0.2 10*3/uL (ref 0.0–0.5)
Eosinophils Relative: 2 %
HCT: 48.9 % (ref 39.0–52.0)
Hemoglobin: 16.5 g/dL (ref 13.0–17.0)
Immature Granulocytes: 0 %
Lymphocytes Relative: 34 %
Lymphs Abs: 3.6 10*3/uL (ref 0.7–4.0)
MCH: 30.6 pg (ref 26.0–34.0)
MCHC: 33.7 g/dL (ref 30.0–36.0)
MCV: 90.6 fL (ref 80.0–100.0)
Monocytes Absolute: 0.8 10*3/uL (ref 0.1–1.0)
Monocytes Relative: 8 %
Neutro Abs: 5.9 10*3/uL (ref 1.7–7.7)
Neutrophils Relative %: 55 %
Platelets: 242 10*3/uL (ref 150–400)
RBC: 5.4 MIL/uL (ref 4.22–5.81)
RDW: 12.2 % (ref 11.5–15.5)
WBC: 10.7 10*3/uL — ABNORMAL HIGH (ref 4.0–10.5)
nRBC: 0 % (ref 0.0–0.2)

## 2019-06-11 NOTE — ED Triage Notes (Signed)
Pt arrives via [facility] from work, states he got very hot today at work and felt really bad, went home and began having a sudden onset of R sided, severe head pain. No neuro deficits. States it feels like a "blood clot popped." hx of htn- not compliant with meds. Did take 2 amlodipine tabs about 45 mins ago, EMS BP: 168/115, HR 94. Pt a/ox4, resp e/u, nad. Speech clear, face symmetrical, grips strength equal. Ambulatory to ambulance. Denies visual disturbances.

## 2019-06-12 ENCOUNTER — Emergency Department (HOSPITAL_COMMUNITY): Payer: Self-pay

## 2019-06-12 LAB — CK: Total CK: 272 U/L (ref 49–397)

## 2019-06-12 MED ORDER — KETOROLAC TROMETHAMINE 15 MG/ML IJ SOLN
15.0000 mg | Freq: Once | INTRAMUSCULAR | Status: AC
  Administered 2019-06-12: 02:00:00 15 mg via INTRAVENOUS
  Filled 2019-06-12: qty 1

## 2019-06-12 MED ORDER — METOCLOPRAMIDE HCL 5 MG/ML IJ SOLN
10.0000 mg | Freq: Once | INTRAMUSCULAR | Status: AC
  Administered 2019-06-12: 10 mg via INTRAVENOUS
  Filled 2019-06-12: qty 2

## 2019-06-12 MED ORDER — IOHEXOL 350 MG/ML SOLN
100.0000 mL | Freq: Once | INTRAVENOUS | Status: AC | PRN
  Administered 2019-06-12: 03:00:00 100 mL via INTRAVENOUS

## 2019-06-12 MED ORDER — DIPHENHYDRAMINE HCL 50 MG/ML IJ SOLN
12.5000 mg | Freq: Once | INTRAMUSCULAR | Status: AC
  Administered 2019-06-12: 12.5 mg via INTRAVENOUS
  Filled 2019-06-12: qty 1

## 2019-06-12 NOTE — ED Notes (Signed)
Pt ambulating to and from bathroom successfully with no assist

## 2019-06-12 NOTE — Discharge Instructions (Signed)
Please follow up with your doctor for further management of high blood pressure. Return to the ED as needed for new or concerning symptoms.

## 2019-06-12 NOTE — ED Notes (Signed)
Patient transported to CT 

## 2019-06-12 NOTE — ED Notes (Signed)
Patient verbalizes understanding of discharge instructions. Opportunity for questioning and answers were provided. Armband removed by staff, pt discharged from ED by wheelchair and states he will call an Kenneth Brooks

## 2019-06-12 NOTE — ED Provider Notes (Signed)
[facility] EMERGENCY DEPARTMENT Provider Note   CSN: [id number] Arrival date & time: 06/11/19  2010     History   Chief Complaint Chief Complaint  Patient presents with   Hypertension   Headache    HPI Kenneth Brooks is a 43 y.o. male.     Patient to ED for evaluation of sudden onset, severe right sided headache while at home this evening. No nausea or vomiting, visual changes, weakness. The headache is constant without modifying factors. No fever. He reports history of hypertension and medication noncompliance. No history of headaches in the past. He tried taking a Tylenol without relief.   The history is provided by the patient. No language interpreter was used.    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 Authorizing Provider  amLODipine (NORVASC) 5 MG tablet Take 1 tablet (5 mg total) by mouth daily. 01/09/19  Yes Lacretia Leigh, MD  methadone (DOLOPHINE) 10 MG/ML solution Take 100 mg by mouth daily.   Yes [provider]  gabapentin (NEURONTIN) 300 MG capsule Take 1 capsule (300 mg total) by mouth 3 (three) times daily. Patient not taking: Reported on 06/12/2019 06/28/17   Tereasa Coop, PA-C    Family History Family History  Problem Relation Age of Onset   Cirrhosis Mother    Liver cancer Mother    Hypertension  Father    Stroke Father 93   Heart attack Father    Crohn's disease Brother    Cancer Maternal Grandmother        smoker    Social History Social History   Tobacco Use   Smoking status: Current Every Day Smoker    Packs/day: 0.50    Types: Cigarettes    Start date: 12/04/1987   Smokeless tobacco: Never Used  Substance Use Topics   Alcohol use: Not on file   Drug use: No    Comment: former heroin user",off for 2 years now".     Allergies   Bee venom   Review of Systems Review of Systems  Constitutional: Negative for chills and fever.  HENT: Negative.   Eyes: Negative for visual disturbance.  Respiratory: Negative.   Cardiovascular: Negative.   Gastrointestinal: Negative.  Negative for nausea.  Musculoskeletal: Negative.   Skin: Negative.   Neurological: Positive for headaches. Negative for syncope, weakness and light-headedness.     Physical Exam Updated Vital Signs BP 104/74    Pulse 64    Temp 98 F (36.7 C) (Oral)    Resp 16    SpO2 92%   Physical Exam Vitals signs and nursing note reviewed.  Constitutional:      Appearance: He is well-developed.  HENT:  Head: Normocephalic and atraumatic.     Right Ear: Tympanic membrane normal.  Eyes:     General: No visual field deficit. Neck:     Musculoskeletal: Normal range of motion and neck supple.  Cardiovascular:     Rate and Rhythm: Normal rate and regular rhythm.  Pulmonary:     Effort: Pulmonary effort is normal.     Breath sounds: Normal breath sounds.  Abdominal:     General: Bowel sounds are normal.     Palpations: Abdomen is soft.     Tenderness: There is no abdominal tenderness. There is no guarding or rebound.  Musculoskeletal: Normal range of motion.  Skin:    General: Skin is warm and dry.     Findings: No rash.  Neurological:     Mental Status: He is alert and oriented to person, place, and time.     GCS: GCS eye subscore is 4. GCS verbal subscore is 5. GCS motor subscore is 6.       Cranial Nerves: No dysarthria or facial asymmetry.     Sensory: No sensory deficit.     Motor: No weakness.     Coordination: Coordination normal.     Gait: Gait normal.     Comments: CN's 3-12 grossly intact.      ED Treatments / Results  Labs (all labs ordered are listed, but only abnormal results are displayed) Labs Reviewed  COMPREHENSIVE METABOLIC PANEL - Abnormal; Notable for the following components:      Result Value   Total Protein 8.6 (*)    AST 46 (*)    All other components within normal limits  CBC WITH DIFFERENTIAL/PLATELET - Abnormal; Notable for the following components:   WBC 10.7 (*)    All other components within normal limits  CK    EKG None  Radiology Ct Angio Head W Or Wo Contrast  Result Date: 06/12/2019 CLINICAL DATA:  Initial evaluation for acute severe headache. EXAM: CT ANGIOGRAPHY HEAD AND NECK TECHNIQUE: Multidetector CT imaging of the head and neck was performed using the standard protocol during bolus administration of intravenous contrast. Multiplanar CT image reconstructions and MIPs were obtained to evaluate the vascular anatomy. Carotid stenosis measurements (when applicable) are obtained utilizing NASCET criteria, using the distal internal carotid diameter as the denominator. CONTRAST:  100mL OMNIPAQUE IOHEXOL 350 MG/ML SOLN COMPARISON:  Prior CT from earlier same day. FINDINGS: CTA NECK FINDINGS Aortic arch: Visualized aortic arch of normal caliber with normal branch pattern. No flow-limiting stenosis seen about the origin of the great vessels. Visualized subclavian arteries widely patent. Right carotid system: Right common and internal carotid arteries widely patent without stenosis, dissection, or occlusion. No atheromatous narrowing about the right carotid bifurcation. Left carotid system: Left common and internal carotid arteries widely patent without stenosis, dissection, or occlusion. No significant atheromatous narrowing about the left  carotid bifurcation. Vertebral arteries: Both vertebral arteries arise from the subclavian arteries. Both vertebral arteries widely patent within the neck without stenosis, dissection, or occlusion. Skeleton: No acute osseous finding. No discrete lytic or blastic osseous lesions. Scattered dental caries about the remaining dentition. Other neck: No other acute soft tissue abnormality within the neck. Upper chest: Visualized upper chest demonstrates no acute finding. Atelectatic changes noted within the visualized lungs. Underlying paraseptal and centrilobular emphysema. Review of the MIP images confirms the above findings CTA HEAD FINDINGS Anterior circulation: Petrous, cavernous, and supraclinoid segments of the internal carotid arteries are widely patent without stenosis. A1 segments widely patent.  Normal anterior communicating artery complex. Anterior cerebral arteries patent to their distal aspects without stenosis. M1 segments widely patent bilaterally. Normal MCA bifurcations. Distal MCA branches well perfused and symmetric. Posterior circulation: Vertebral arteries widely patent to the vertebrobasilar junction. Left vertebral artery slightly dominant. Patent right PICA. Left PICA not seen. Dominant left anterior inferior cerebral artery. Basilar widely patent to its distal aspect. Superior cerebral arteries patent bilaterally. Both of the posterior cerebral arteries primarily supplied via the basilar and are widely patent to their distal aspects. Venous sinuses: Grossly patent allowing for timing of the contrast bolus. Anatomic variants: None significant. Delayed phase: Not performed. Review of the MIP images confirms the above findings IMPRESSION: 1. Negative CTA of the head and neck with no evidence for large vessel occlusion, hemodynamically significant stenosis, or other acute vascular abnormality. No intracranial aneurysm. 2. Emphysema. Electronically Signed   By: Rise MuBenjamin  McClintock M.D.   On:  06/12/2019 03:19   Ct Head Wo Contrast  Result Date: 06/11/2019 CLINICAL DATA:  Headaches EXAM: CT HEAD WITHOUT CONTRAST TECHNIQUE: Contiguous axial images were obtained from the base of the skull through the vertex without intravenous contrast. COMPARISON:  None. FINDINGS: Brain: No evidence of acute infarction, hemorrhage, hydrocephalus, extra-axial collection or mass lesion/mass effect. Vascular: No hyperdense vessel or unexpected calcification. Skull: Normal. Negative for fracture or focal lesion. Sinuses/Orbits: No acute finding. Other: None. IMPRESSION: Normal head CT Electronically Signed   By: Alcide CleverMark  Lukens M.D.   On: 06/11/2019 23:21   Ct Angio Neck W And/or Wo Contrast  Result Date: 06/12/2019 CLINICAL DATA:  Initial evaluation for acute severe headache. EXAM: CT ANGIOGRAPHY HEAD AND NECK TECHNIQUE: Multidetector CT imaging of the head and neck was performed using the standard protocol during bolus administration of intravenous contrast. Multiplanar CT image reconstructions and MIPs were obtained to evaluate the vascular anatomy. Carotid stenosis measurements (when applicable) are obtained utilizing NASCET criteria, using the distal internal carotid diameter as the denominator. CONTRAST:  100mL OMNIPAQUE IOHEXOL 350 MG/ML SOLN COMPARISON:  Prior CT from earlier same day. FINDINGS: CTA NECK FINDINGS Aortic arch: Visualized aortic arch of normal caliber with normal branch pattern. No flow-limiting stenosis seen about the origin of the great vessels. Visualized subclavian arteries widely patent. Right carotid system: Right common and internal carotid arteries widely patent without stenosis, dissection, or occlusion. No atheromatous narrowing about the right carotid bifurcation. Left carotid system: Left common and internal carotid arteries widely patent without stenosis, dissection, or occlusion. No significant atheromatous narrowing about the left carotid bifurcation. Vertebral arteries: Both vertebral  arteries arise from the subclavian arteries. Both vertebral arteries widely patent within the neck without stenosis, dissection, or occlusion. Skeleton: No acute osseous finding. No discrete lytic or blastic osseous lesions. Scattered dental caries about the remaining dentition. Other neck: No other acute soft tissue abnormality within the neck. Upper chest: Visualized upper chest demonstrates no acute finding. Atelectatic changes noted within the visualized lungs. Underlying paraseptal and centrilobular emphysema. Review of the MIP images confirms the above findings CTA HEAD FINDINGS Anterior circulation: Petrous, cavernous, and supraclinoid segments of the internal carotid arteries are widely patent without stenosis. A1 segments widely patent. Normal anterior communicating artery complex. Anterior cerebral arteries patent to their distal aspects without stenosis. M1 segments widely patent bilaterally. Normal MCA bifurcations. Distal MCA branches well perfused and symmetric. Posterior circulation: Vertebral arteries widely patent to the vertebrobasilar junction. Left vertebral artery slightly dominant. Patent right PICA. Left PICA not seen. Dominant left anterior inferior cerebral artery. Basilar  widely patent to its distal aspect. Superior cerebral arteries patent bilaterally. Both of the posterior cerebral arteries primarily supplied via the basilar and are widely patent to their distal aspects. Venous sinuses: Grossly patent allowing for timing of the contrast bolus. Anatomic variants: None significant. Delayed phase: Not performed. Review of the MIP images confirms the above findings IMPRESSION: 1. Negative CTA of the head and neck with no evidence for large vessel occlusion, hemodynamically significant stenosis, or other acute vascular abnormality. No intracranial aneurysm. 2. Emphysema. Electronically Signed   By: Rise MuBenjamin  McClintock M.D.   On: 06/12/2019 03:19    Procedures Procedures (including  critical care time)  Medications Ordered in ED Medications  metoCLOPramide (REGLAN) injection 10 mg (10 mg Intravenous Given 06/12/19 0138)  ketorolac (TORADOL) 15 MG/ML injection 15 mg (15 mg Intravenous Given 06/12/19 0138)  diphenhydrAMINE (BENADRYL) injection 12.5 mg (12.5 mg Intravenous Given 06/12/19 0138)  iohexol (OMNIPAQUE) 350 MG/ML injection 100 mL (100 mLs Intravenous Contrast Given 06/12/19 0232)     Initial Impression / Assessment and Plan / ED Course  I have reviewed the triage vital signs and the nursing notes.  Pertinent labs & imaging results that were available during my care of the patient were reviewed by me and considered in my medical decision making (see chart for details).        Patient to ED with sudden onset, severe, constant, right sided headache earlier this evening. No other symptoms. Concern for uncontrolled hypertension and possible stroke.   He is overall well appearing, holding the right side of his head, somewhat appears uncomfortable.   No neurologic deficits on exam. Head CT negative and obtained within 6 hours of headache onset. This was discussed with neurology who advised CTA head and neck were indicated and, if negative, are definitive studies. No LP indicated in that case.  IV Reglan, Benadryl and Toradol given for headache with complete resolution.   CTA studies negative for abnormal vascular findings, no aneurysm.   The patient is felt appropriate for discharge home. Encouraged PCP follow up to discuss management of established HTN.   Final Clinical Impressions(s) / ED Diagnoses   Final diagnoses:  None   1. Headache 2. HTN  ED Discharge Orders    None       Danne HarborUpstill, Blayn Whetsell, PA-C 06/14/19 0920    Glynn Octaveancour, Stephen, MD 06/14/19 2032

## 2019-06-23 ENCOUNTER — Encounter: Payer: Self-pay | Admitting: Physician Assistant

## 2019-07-09 ENCOUNTER — Other Ambulatory Visit: Payer: Self-pay

## 2019-07-09 ENCOUNTER — Encounter (HOSPITAL_COMMUNITY): Payer: Self-pay | Admitting: *Deleted

## 2019-07-09 ENCOUNTER — Emergency Department (HOSPITAL_COMMUNITY)
Admission: EM | Admit: 2019-07-09 | Discharge: 2019-07-09 | Disposition: A | Discharge status: Home / Self Care | Payer: Self-pay | Attending: Emergency Medicine | Admitting: Emergency Medicine

## 2019-07-09 DIAGNOSIS — Z79899 Other long term (current) drug therapy: Secondary | ICD-10-CM | POA: Insufficient documentation

## 2019-07-09 DIAGNOSIS — R21 Rash and other nonspecific skin eruption: Secondary | ICD-10-CM | POA: Insufficient documentation

## 2019-07-09 DIAGNOSIS — L539 Erythematous condition, unspecified: Secondary | ICD-10-CM | POA: Insufficient documentation

## 2019-07-09 DIAGNOSIS — F17210 Nicotine dependence, cigarettes, uncomplicated: Secondary | ICD-10-CM | POA: Insufficient documentation

## 2019-07-09 LAB — CBC WITH DIFFERENTIAL/PLATELET
Abs Immature Granulocytes: 0.02 10*3/uL (ref 0.00–0.07)
Basophils Absolute: 0.1 10*3/uL (ref 0.0–0.1)
Basophils Relative: 1 %
Eosinophils Absolute: 0.3 10*3/uL (ref 0.0–0.5)
Eosinophils Relative: 3 %
HCT: 47.2 % (ref 39.0–52.0)
Hemoglobin: 16.2 g/dL (ref 13.0–17.0)
Immature Granulocytes: 0 %
Lymphocytes Relative: 41 %
Lymphs Abs: 4.1 10*3/uL — ABNORMAL HIGH (ref 0.7–4.0)
MCH: 30.7 pg (ref 26.0–34.0)
MCHC: 34.3 g/dL (ref 30.0–36.0)
MCV: 89.4 fL (ref 80.0–100.0)
Monocytes Absolute: 0.7 10*3/uL (ref 0.1–1.0)
Monocytes Relative: 7 %
Neutro Abs: 4.9 10*3/uL (ref 1.7–7.7)
Neutrophils Relative %: 48 %
Platelets: 249 10*3/uL (ref 150–400)
RBC: 5.28 MIL/uL (ref 4.22–5.81)
RDW: 11.9 % (ref 11.5–15.5)
WBC: 10.1 10*3/uL (ref 4.0–10.5)
nRBC: 0 % (ref 0.0–0.2)

## 2019-07-09 LAB — PROTIME-INR
INR: 1.2 (ref 0.8–1.2)
Prothrombin Time: 14.9 seconds (ref 11.4–15.2)

## 2019-07-09 LAB — APTT: aPTT: 41 seconds — ABNORMAL HIGH (ref 24–36)

## 2019-07-09 MED ORDER — MELOXICAM 15 MG PO TABS: 15.0000 mg | ORAL_TABLET | Freq: Every day | ORAL | 0 refills | Status: DC

## 2019-07-09 MED ORDER — FAMOTIDINE 20 MG PO TABS: 20.0000 mg | ORAL_TABLET | Freq: Two times a day (BID) | ORAL | 0 refills | Status: DC

## 2019-07-09 NOTE — ED Triage Notes (Signed)
Pt reports redness and some swelling to bilateral legs. Pt states that he ha shad this intermittently for the last year and has seen his PCP for same with no diagnosis.

## 2019-07-09 NOTE — ED Provider Notes (Signed)
[facility] Provider Note   CSN: [id number] Arrival date & time: 07/09/19  [id number]    History   Chief Complaint Chief Complaint  Patient presents with   Leg Swelling    HPI Kenneth Brooks is a 43 y.o. male with history of anemia, anxiety, hepatitis C, liver cirrhosis, substance abuse in remission presenting for evaluation of acute onset, persistent rash for the last 2 days.  He reports that he has had this rash intermittently for the last year or so.  It is localized to his lower extremities.  He reports this time it covers a larger area along his lower extremities and is a little more painful.  He describes it as a burning sensation.  He was seen and evaluated for this rash by his PCP who prescribed an antifungal cream.  Denies numbness or weakness.  He reports that when the rash comes on it is painful to the point that he will have to miss work.  It would last for a few days before resolving spontaneously on its own.  Denies any new soaps, shampoos, detergents, or lotions.  No headaches or fevers.  No neck stiffness.  Reports this does not feel like when he had meningitis when he was 43 years old.  No known insect bites prior to the rash onset but he is unsure.     The history is provided by the patient.    Past Medical History:  Diagnosis Date   Anemia    Anxiety    Cirrhosis ([facility])    Depression    Hepatitis C    Meningitis    spinal   Neuromuscular disorder ([facility])    siactic nerve probleme   Panic attack    Substance abuse ([facility])    former heroin user    Patient Active Problem List   Diagnosis Date Noted   Substance abuse ([facility]) 01/28/2018   Cirrhosis of liver ([facility]) 07/31/2017   Chronic hepatitis C without hepatic coma ([facility]) 07/01/2017    Past Surgical History:  Procedure Laterality Date   Spinal Tap     WISDOM TOOTH EXTRACTION          Home Medications    Prior to Admission medications   Medication Sig  Start Date End Date Taking? Authorizing Provider  amLODipine (NORVASC) 5 MG tablet Take 1 tablet (5 mg total) by mouth daily. 01/09/19   Lorre NickAllen, Anthony, MD  famotidine (PEPCID) 20 MG tablet Take 1 tablet (20 mg total) by mouth 2 (two) times daily. 07/09/19   Girtrude Enslin A, PA-C  gabapentin (NEURONTIN) 300 MG capsule Take 1 capsule (300 mg total) by mouth 3 (three) times daily. Patient not taking: Reported on 06/12/2019 06/28/17   Ofilia Neaslark, Michael L, PA-C  meloxicam (MOBIC) 15 MG tablet Take 1 tablet (15 mg total) by mouth daily. 07/09/19   Romano Stigger A, PA-C  methadone (DOLOPHINE) 10 MG/ML solution Take 100 mg by mouth daily.    [provider]    Family History Family History  Problem Relation Age of Onset   Cirrhosis Mother    Liver cancer Mother    Hypertension Father    Stroke Father [id number]   Heart attack Father    Crohn's disease Brother    Cancer Maternal Grandmother        smoker    Social History Social History   Tobacco Use   Smoking status: Current Every Day Smoker    Packs/day: 0.50    Types:  Cigarettes    Start date: 12/04/1987   Smokeless tobacco: Never Used  Substance Use Topics   Alcohol use: Not on file   Drug use: No    Comment: former heroin user",off for 2 years now".     Allergies   Bee venom   Review of Systems Review of Systems  Constitutional: Negative for fever.  Musculoskeletal: Negative for neck stiffness.  Skin: Positive for rash.  Neurological: Negative for weakness, numbness and headaches.     Physical Exam Updated Vital Signs BP (!) 132/96 (BP Location: Right Arm)    Pulse 80    Temp 98.6 F (37 C) (Oral)    Resp 16    SpO2 96%   Physical Exam Vitals signs and nursing note reviewed.  Constitutional:      General: He is not in acute distress.    Appearance: He is well-developed.  HENT:     Head: Normocephalic and atraumatic.     Mouth/Throat:     Comments: No swelling of lips or tongue, tolerating secretions without  difficulty. Eyes:     General:        Right eye: No discharge.        Left eye: No discharge.     Conjunctiva/sclera: Conjunctivae normal.  Neck:     Musculoskeletal: Normal range of motion and neck supple. No neck rigidity or muscular tenderness.     Vascular: No JVD.     Trachea: No tracheal deviation.  Cardiovascular:     Rate and Rhythm: Normal rate.     Pulses: Normal pulses.     Comments: 2+ DP/PT pulses bilaterally, no lower extremity edema Pulmonary:     Effort: Pulmonary effort is normal.  Abdominal:     General: There is no distension.  Musculoskeletal: Normal range of motion.        General: No swelling or tenderness.  Skin:    General: Skin is warm and dry.     Findings: Erythema and rash present.     Comments: See below image.  Patient with a petechial nonblanching rash to the bilateral lower extremities circumferentially.  Appears to be clustered more distally.  Nikolsky sign absent.  Neurological:     Mental Status: He is alert.     Comments: Fluent speech, no facial droop, sensation intact to soft touch of bilateral lower extremities.  Moves extremity spontaneously with no difficulty.  Psychiatric:        Behavior: Behavior normal.        ED Treatments / Results  Labs (all labs ordered are listed, but only abnormal results are displayed) Labs Reviewed  CBC WITH DIFFERENTIAL/PLATELET - Abnormal; Notable for the following components:      Result Value   Lymphs Abs 4.1 (*)    All other components within normal limits  APTT - Abnormal; Notable for the following components:   aPTT 41 (*)    All other components within normal limits  PROTIME-INR    EKG None  Radiology No results found.  Procedures Procedures (including critical care time)  Medications Ordered in ED Medications - No data to display   Initial Impression / Assessment and Plan / ED Course  I have reviewed the triage vital signs and the nursing notes.  Pertinent labs & imaging  results that were available during my care of the patient were reviewed by me and considered in my medical decision making (see chart for details).        Patient with rash  for the last 2 days.  He is afebrile, vital signs are stable.  He is nontoxic in appearance.  Reports this rash has been intermittent for the last several years.  Has a history of hepatitis and cirrhosis.  Abdomen is soft and nontender.  No constitutional symptoms.  Nikolsky sign absent.  Lab work obtained which showed no thrombocytopenia, no anemia, no metabolic derangements.  His APTT is mildly prolonged and he has mild elevation in absolute lymphocyte count though unclear of clinical significance.  I suspect he may have a small vessel vasculitis versus petechial rash related to his chronic liver disease.  However, no signs of secondary skin infection, doubt DVT.  Doubt TENS, Trudie BucklerSteven Johnson syndrome, [facility] [place] spotted fever.  No meningeal signs or headache or fever to suggest meningitis.  No evidence of anaphylaxis, no angioedema, tolerating secretions without difficulty.  Will send home with Mobic to take as needed for pain when the rash flares, Pepcid for further protection against gastritis.  Recommend follow-up with PCP or dermatologist for reevaluation of symptoms and biopsy.  Discussed strict ED return precautions.  Patient verbalized understanding of and agreement with plan and patient stable for discharge home at this time.  Discussed with Dr. Dalene SeltzerSchlossman who agrees with assessment and plan at this time.  Final Clinical Impressions(s) / ED Diagnoses   Final diagnoses:  Rash in adult    ED Discharge Orders         Ordered    meloxicam (MOBIC) 15 MG tablet  Daily     07/09/19 1109    famotidine (PEPCID) 20 MG tablet  2 times daily     07/09/19 [address]1109           Uvaldo Rybacki A, PA-C 07/09/19 1123    Alvira MondaySchlossman, Erin, MD 07/11/19 1152

## 2019-07-09 NOTE — Discharge Instructions (Signed)
Start taking Mobic once daily with food.  Do not take ibuprofen, Advil, Aleve, or Motrin while taking this medication.  This is an anti-inflammatory medication.  You can also take it with Pepcid which can help ease up upset stomach side effects.  If you have any issues with upset stomach, stop taking this medication.  Do not take any Tylenol due to your liver disease.  Follow-up with your primary care doctor for reevaluation of your symptoms.  I have given you the information for [place] and wellness and primary care at [facility] which can provide primary care services to you at a reduced rate.  Call them and tell them you were referred from the emergency department.  There may be a waiting list.  They also have financial advisors which can make follow-up with a specialist more affordable.  They will likely want to do a biopsy of your rash the next time that you have it.  Return to the emergency department if any concerning signs or symptoms develop such as fevers, persistent vomiting, neck stiffness, severe headaches.

## 2019-07-09 NOTE — ED Notes (Signed)
Patient verbalizes understanding of discharge instructions. Opportunity for questioning and answering were provided.  patient discharged from ED.  

## 2019-07-20 NOTE — Progress Notes (Signed)
Patient ID: Kenneth Brooks, male   DOB: 1976/07/15, 43 y.o.   MRN: [id number]  Virtual Visit via Telephone Note  I connected with Kenneth Brooks on 07/22/19 at  9:10 AM EDT by telephone and verified that I am speaking with the correct person using two identifiers.   I discussed the limitations, risks, security and privacy concerns of performing an evaluation and management service by telephone and the availability of in person appointments. I also discussed with the patient that there may be a patient responsible charge related to this service. The patient expressed understanding and agreed to proceed.  Patient location: home My Location:  [facility] office Persons on the call:  Me and the patient   History of Present Illness: After being seen in the ED 07/09/2019 for rash.  Rash worsens with heat. He works in a [employer].  Rash has been on and off for 1 year.    Treated for Hep C about 1-2 years ago.    Checks BP regularly 140-150/90-97.  No HA/CP  From ED  HPI: Kenneth Brooks is a 43 y.o. male with history of anemia, anxiety, hepatitis C, liver cirrhosis, substance abuse in remission presenting for evaluation of acute onset, persistent rash for the last 2 days.  He reports that he has had this rash intermittently for the last year or so.  It is localized to his lower extremities.  He reports this time it covers a larger area along his lower extremities and is a little more painful.  He describes it as a burning sensation.  He was seen and evaluated for this rash by his PCP who prescribed an antifungal cream.  Denies numbness or weakness.  He reports that when the rash comes on it is painful to the point that he will have to miss work.  It would last for a few days before resolving spontaneously on its own.  Denies any new soaps, shampoos, detergents, or lotions.  No headaches or fevers.  No neck stiffness.  Reports this does not feel like when he had meningitis when he was 43 years old.  No known  insect bites prior to the rash onset but he is unsure  From A/P: Patient with rash for the last 2 days.  He is afebrile, vital signs are stable.  He is nontoxic in appearance.  Reports this rash has been intermittent for the last several years.  Has a history of hepatitis and cirrhosis.  Abdomen is soft and nontender.  No constitutional symptoms.  Nikolsky sign absent.  Lab work obtained which showed no thrombocytopenia, no anemia, no metabolic derangements.  His APTT is mildly prolonged and he has mild elevation in absolute lymphocyte count though unclear of clinical significance.  I suspect he may have a small vessel vasculitis versus petechial rash related to his chronic liver disease.  However, no signs of secondary skin infection, doubt DVT.  Doubt TENS, Katherina Right syndrome, [facility] spotted fever.  No meningeal signs or headache or fever to suggest meningitis.  No evidence of anaphylaxis, no angioedema, tolerating secretions without difficulty.  Will send home with Mobic to take as needed for pain when the rash flares, Pepcid for further protection against gastritis.  Recommend follow-up with PCP or dermatologist for reevaluation of symptoms and biopsy.  Discussed strict ED return precautions.  Patient verbalized understanding of and agreement with plan and patient stable for discharge home at this time.  Discussed with Dr. Billy Fischer who agrees with assessment and plan  at this time.    Observations/Objective: A&Ox3   Assessment and Plan: 1. Vasculitis [facility](HCC) Not improving - Ambulatory referral to Dermatology  2. Hypertension, unspecified type Uncontrolled-increase dose and check BP daily and record - amLODipine (NORVASC) 10 MG tablet; Take 1 tablet (10 mg total) by mouth daily.  Dispense: 90 tablet; Refill: 3  3. Encounter for examination following treatment at hospital RTW tomorrow    Follow Up Instructions: Assign PCP in 1 month   I discussed the assessment and treatment plan  with the patient. The patient was provided an opportunity to ask questions and all were answered. The patient agreed with the plan and demonstrated an understanding of the instructions.   The patient was advised to call back or seek an in-person evaluation if the symptoms worsen or if the condition fails to improve as anticipated.  I provided 13 minutes of non-face-to-face time during this encounter.   Georgian CoAngela Deo Mehringer, PA-C

## 2019-07-22 ENCOUNTER — Other Ambulatory Visit: Payer: Self-pay

## 2019-07-22 ENCOUNTER — Ambulatory Visit: Payer: Self-pay | Attending: Family Medicine | Admitting: Physician Assistant

## 2019-07-22 DIAGNOSIS — I10 Essential (primary) hypertension: Secondary | ICD-10-CM

## 2019-07-22 DIAGNOSIS — I776 Arteritis, unspecified: Secondary | ICD-10-CM

## 2019-07-22 DIAGNOSIS — Z09 Encounter for follow-up examination after completed treatment for conditions other than malignant neoplasm: Secondary | ICD-10-CM

## 2019-07-22 MED ORDER — AMLODIPINE BESYLATE 10 MG PO TABS: 10.0000 mg | ORAL_TABLET | Freq: Every day | ORAL | 3 refills | Status: DC

## 2019-07-22 NOTE — Progress Notes (Signed)
Patient verified DOB Patient has taken medication today Patient has only had apple juice. Patient denies pain at this time. Patient states the heat increases swelling in both legs with a burning pain.

## 2019-08-07 ENCOUNTER — Ambulatory Visit: Payer: Self-pay | Attending: Family Medicine

## 2019-08-07 ENCOUNTER — Other Ambulatory Visit: Payer: Self-pay

## 2019-08-19 ENCOUNTER — Encounter: Payer: Self-pay | Admitting: Gastroenterology

## 2019-08-20 ENCOUNTER — Telehealth: Payer: Self-pay | Admitting: General Practice

## 2019-08-20 NOTE — Telephone Encounter (Signed)
Pt was sent a letter from financial dept. Inform them, that the application they submitted was incomplete, since they were missing some documentation at the time of the appointment, Pt need to reschedule and resubmit all new papers and application for [employer] and [employer] old documents has been sent back to Pt and need to make a new appt °

## 2019-08-25 ENCOUNTER — Other Ambulatory Visit: Payer: Self-pay

## 2019-08-25 ENCOUNTER — Emergency Department (HOSPITAL_COMMUNITY)
Admission: EM | Admit: 2019-08-25 | Discharge: 2019-08-25 | Disposition: A | Discharge status: Home / Self Care | Payer: PRIVATE HEALTH INSURANCE | Attending: Emergency Medicine | Admitting: Emergency Medicine

## 2019-08-25 ENCOUNTER — Emergency Department (HOSPITAL_COMMUNITY): Payer: PRIVATE HEALTH INSURANCE

## 2019-08-25 ENCOUNTER — Encounter (HOSPITAL_COMMUNITY): Payer: Self-pay

## 2019-08-25 DIAGNOSIS — Z79899 Other long term (current) drug therapy: Secondary | ICD-10-CM | POA: Insufficient documentation

## 2019-08-25 DIAGNOSIS — Y9301 Activity, walking, marching and hiking: Secondary | ICD-10-CM | POA: Insufficient documentation

## 2019-08-25 DIAGNOSIS — S92345A Nondisplaced fracture of fourth metatarsal bone, left foot, initial encounter for closed fracture: Secondary | ICD-10-CM | POA: Diagnosis not present

## 2019-08-25 DIAGNOSIS — F17210 Nicotine dependence, cigarettes, uncomplicated: Secondary | ICD-10-CM | POA: Insufficient documentation

## 2019-08-25 DIAGNOSIS — W010XXA Fall on same level from slipping, tripping and stumbling without subsequent striking against object, initial encounter: Secondary | ICD-10-CM | POA: Diagnosis not present

## 2019-08-25 DIAGNOSIS — S92325A Nondisplaced fracture of second metatarsal bone, left foot, initial encounter for closed fracture: Secondary | ICD-10-CM | POA: Diagnosis not present

## 2019-08-25 DIAGNOSIS — S99922A Unspecified injury of left foot, initial encounter: Secondary | ICD-10-CM | POA: Diagnosis present

## 2019-08-25 DIAGNOSIS — Y990 Civilian activity done for income or pay: Secondary | ICD-10-CM | POA: Insufficient documentation

## 2019-08-25 DIAGNOSIS — Y9259 Other trade areas as the place of occurrence of the external cause: Secondary | ICD-10-CM | POA: Insufficient documentation

## 2019-08-25 DIAGNOSIS — S92902A Unspecified fracture of left foot, initial encounter for closed fracture: Secondary | ICD-10-CM

## 2019-08-25 MED ORDER — IBUPROFEN 800 MG PO TABS: 800.0000 mg | ORAL_TABLET | Freq: Three times a day (TID) | ORAL | 0 refills | Status: DC

## 2019-08-25 MED ORDER — ACETAMINOPHEN 500 MG PO TABS
1000.0000 mg | ORAL_TABLET | Freq: Once | ORAL | Status: AC
  Administered 2019-08-25: 1000 mg via ORAL
  Filled 2019-08-25: qty 2

## 2019-08-25 MED ORDER — TIZANIDINE HCL 2 MG PO CAPS: 2.0000 mg | ORAL_CAPSULE | Freq: Three times a day (TID) | ORAL | 0 refills | Status: DC

## 2019-08-25 MED ORDER — KETOROLAC TROMETHAMINE 15 MG/ML IJ SOLN
15.0000 mg | Freq: Once | INTRAMUSCULAR | Status: AC
  Administered 2019-08-25: 15 mg via INTRAVENOUS
  Filled 2019-08-25: qty 1

## 2019-08-25 MED ORDER — MORPHINE SULFATE (PF) 4 MG/ML IV SOLN
8.0000 mg | Freq: Once | INTRAVENOUS | Status: AC
  Administered 2019-08-25: 8 mg via INTRAVENOUS
  Filled 2019-08-25: qty 2

## 2019-08-25 NOTE — ED Notes (Signed)
Called Ortho for cam walker

## 2019-08-25 NOTE — Consult Note (Addendum)
Reason for Consult:Left foot fxs Referring Physician: [name] is an 43 y.o. male.  HPI: Kenneth Brooks was at work and tripped over some wiring. As he went down his foot went forward and he heard/felt something snap. He had immediate severe pain and could not bear weight. He came to the ED for evaluation and x-rays showed some MT fxs and orthopedic surgery was consulted.  Past Medical History:  Diagnosis Date  . Anemia   . Anxiety   . Cirrhosis (HCC)   . Depression   . Hepatitis C   . Meningitis    spinal  . Neuromuscular disorder (HCC)    siactic nerve probleme  . Panic attack   . Substance abuse (HCC)    former heroin user    Past Surgical History:  Procedure Laterality Date  . Spinal Tap    . WISDOM TOOTH EXTRACTION      Family History  Problem Relation Age of Onset  . Cirrhosis Mother   . Liver cancer Mother   . Hypertension Father   . Stroke Father 39  . Heart attack Father   . Crohn's disease Brother   . Cancer Maternal Grandmother        smoker    Social History:  reports that he has been smoking cigarettes. He started smoking about 31 years ago. He has been smoking about 0.50 packs per day. He has never used smokeless tobacco. He reports previous alcohol use. He reports that he does not use drugs.  Allergies:  Allergies  Allergen Reactions  . Bee Venom Shortness Of Breath    Medications: I have reviewed the patient's current medications.  No results found for this or any previous visit (from the past 48 hour(s)).  Dg Foot Complete Left  Result Date: 08/25/2019 CLINICAL DATA:  fall at work today. Tripped over a wire and states his left foot bent underneath him. Patient indicates pain along medial surface of left foot particularly at the 1st metatarsal base. Reports prior left great toe injury EXAM: LEFT FOOT - COMPLETE 3+ VIEW COMPARISON:  None. FINDINGS: Transverse fracture across the proximal shaft of the fourth metatarsal, distracted less  than 1 mm, without angulation. No definite involvement of the proximal articular surface. Transverse fracture across the proximal shaft of the second metatarsal, distracted 1-2 mm, without displacement or angulation. No definite intra-articular involvement. Otherwise normal mineralization and alignment. No significant osseous degenerative change. Regional soft tissues unremarkable. IMPRESSION: Fractures of the second and fourth metatarsals as above. No definite intra-articular involvement. Electronically Signed   By: Kenneth Brooks M.D.   On: 08/25/2019 14:16    Review of Systems  Constitutional: Negative for weight loss.  HENT: Negative for ear discharge, ear pain, hearing loss and tinnitus.   Eyes: Negative for blurred vision, double vision, photophobia and pain.  Respiratory: Negative for cough, sputum production and shortness of breath.   Cardiovascular: Negative for chest pain.  Gastrointestinal: Negative for abdominal pain, nausea and vomiting.  Genitourinary: Negative for dysuria, flank pain, frequency and urgency.  Musculoskeletal: Positive for joint pain (Left foot). Negative for back pain, falls, myalgias and neck pain.  Neurological: Negative for dizziness, tingling, sensory change, focal weakness, loss of consciousness and headaches.  Endo/Heme/Allergies: Does not bruise/bleed easily.  Psychiatric/Behavioral: Negative for depression, memory loss and substance abuse. The patient is not nervous/anxious.    Blood pressure 134/82, pulse 78, temperature (!) 97.1 F (36.2 C), temperature source Temporal, resp. rate 16, SpO2 100 %. Physical  Exam  Constitutional: He appears well-developed and well-nourished. No distress.  HENT:  Head: Normocephalic and atraumatic.  Eyes: Conjunctivae are normal. Right eye exhibits no discharge. Left eye exhibits no discharge. No scleral icterus.  Neck: Normal range of motion.  Cardiovascular: Normal rate and regular rhythm.  Respiratory: Effort normal. No  respiratory distress.  Musculoskeletal:     Comments: LLE No traumatic wounds, ecchymosis, or rash  Midfoot severe TTP  No knee or ankle effusion  Knee stable to varus/ valgus and anterior/posterior stress  Sens DPN, SPN, TN intact  Motor EHL, ext, flex, evers 5/5  DP 1+, PT 1+, No significant edema  Neurological: He is alert.  Skin: Skin is warm and dry. He is not diaphoretic.  Psychiatric: He has a normal mood and affect. His behavior is normal.    Assessment/Plan: Left 2,4 MT base fxs -- No e/o Lisfranc instability. Will place in CAM and he may WBAT. F/u with Dr. Stann Mainland in 2 weeks.    Lisette Abu, PA-C Orthopedic Surgery [phone] 08/25/2019, 2:34 PM    I have reviewed the medical record and images with Kenneth Gunner, PA-C.  I agree with his assessment and plan.  I believe he is appropriate for weightbearing as tolerated in a Cam boot.  The Lisfranc complex appears intact on all views.  We will obtain weightbearing x-rays and comparison AP at his first follow-up with me in 2 weeks.   Kenneth Stairs, MD [facility]

## 2019-08-25 NOTE — ED Provider Notes (Signed)
[place] EMERGENCY DEPARTMENT Provider Note   CSN: [id number] Arrival date & time: 08/25/19  1322     History   Chief Complaint Chief Complaint  Patient presents with  . Foot Injury    HPI Kenneth Brooks is a 43 y.o. male.     HPI Patient presents after sustaining an injury at work. Patient was in his usual state of health when he was walking, tripped on a piece of wire. He notes that he heard a stabbing sensation, since that time is had sharp severe pain in his left foot, diffuse radiation about the lower extremity, with no medication provided. No loss of sensation in the foot. He did not fall or hurt anything else.  Past Medical History:  Diagnosis Date  . Anemia   . Anxiety   . Cirrhosis ([place])   . Depression   . Hepatitis C   . Meningitis    spinal  . Neuromuscular disorder ([place])    siactic nerve probleme  . Panic attack   . Substance abuse [facility])    former heroin user    Patient Active Problem List   Diagnosis Date Noted  . Substance abuse ([place]) 01/28/2018  . Cirrhosis of liver ([place]) 07/31/2017  . Chronic hepatitis C without hepatic coma ([place]) 07/01/2017    Past Surgical History:  Procedure Laterality Date  . Spinal Tap    . WISDOM TOOTH EXTRACTION          Home Medications    Prior to Admission medications   Medication Sig Start Date End Date Taking? Authorizing Provider  amLODipine (NORVASC) 10 MG tablet Take 1 tablet (10 mg total) by mouth daily. 07/22/19   Argentina Donovan, PA-C  famotidine (PEPCID) 20 MG tablet Take 1 tablet (20 mg total) by mouth 2 (two) times daily. 07/09/19   Fawze, Mina A, PA-C  gabapentin (NEURONTIN) 300 MG capsule Take 1 capsule (300 mg total) by mouth 3 (three) times daily. Patient not taking: Reported on 06/12/2019 06/28/17   Tereasa Coop, PA-C  meloxicam (MOBIC) 15 MG tablet Take 1 tablet (15 mg total) by mouth daily. Patient not taking: Reported on 07/22/2019 07/09/19   Rodell Perna A, PA-C   methadone (DOLOPHINE) 10 MG/ML solution Take 100 mg by mouth daily.    [provider]    Family History Family History  Problem Relation Age of Onset  . Cirrhosis Mother   . Liver cancer Mother   . Hypertension Father   . Stroke Father 53  . Heart attack Father   . Crohn's disease Brother   . Cancer Maternal Grandmother        smoker    Social History Social History   Tobacco Use  . Smoking status: Current Every Day Smoker    Packs/day: 0.50    Types: Cigarettes    Start date: 12/04/1987  . Smokeless tobacco: Never Used  Substance Use Topics  . Alcohol use: Not Currently    Frequency: Never  . Drug use: No    Comment: former heroin user",off for 2 years now".     Allergies   Bee venom   Review of Systems Review of Systems  Constitutional:       Per HPI, otherwise negative  HENT:       Per HPI, otherwise negative  Respiratory:       Per HPI, otherwise negative  Cardiovascular:       Per HPI, otherwise negative  Gastrointestinal: Negative for vomiting.  Endocrine:       Negative aside from HPI  Genitourinary:       Neg aside from HPI   Musculoskeletal:       Per HPI, otherwise negative  Skin: Negative.   Neurological: Negative for syncope.  Psychiatric/Behavioral:       Hx of substance abuse, on methadone     Physical Exam Updated Vital Signs BP 106/70   Pulse 78   Temp (!) 97.1 F (36.2 C) (Temporal)   Resp 16   SpO2 100%   Physical Exam Vitals signs and nursing note reviewed.  Constitutional:      General: He is not in acute distress.    Appearance: He is well-developed.     Comments: Uncomfortable appearing adult male awake and alert  HENT:     Head: Normocephalic and atraumatic.  Eyes:     Conjunctiva/sclera: Conjunctivae normal.  Cardiovascular:     Rate and Rhythm: Normal rate and regular rhythm.  Pulmonary:     Effort: Pulmonary effort is normal. No respiratory distress.     Breath sounds: No stridor.  Abdominal:      General: There is no distension.  Musculoskeletal:     Left hip: Normal.     Left knee: Normal.       Feet:  Skin:    General: Skin is warm and dry.  Neurological:     Mental Status: He is alert and oriented to person, place, and time.      ED Treatments / Results  Labs (all labs ordered are listed, but only abnormal results are displayed) Labs Reviewed - No data to display  EKG None  Radiology Dg Foot Complete Left  Result Date: 08/25/2019 CLINICAL DATA:  fall at work today. Tripped over a wire and states his left foot bent underneath him. Patient indicates pain along medial surface of left foot particularly at the 1st metatarsal base. Reports prior left great toe injury EXAM: LEFT FOOT - COMPLETE 3+ VIEW COMPARISON:  None. FINDINGS: Transverse fracture across the proximal shaft of the fourth metatarsal, distracted less than 1 mm, without angulation. No definite involvement of the proximal articular surface. Transverse fracture across the proximal shaft of the second metatarsal, distracted 1-2 mm, without displacement or angulation. No definite intra-articular involvement. Otherwise normal mineralization and alignment. No significant osseous degenerative change. Regional soft tissues unremarkable. IMPRESSION: Fractures of the second and fourth metatarsals as above. No definite intra-articular involvement. Electronically Signed   By: Corlis Leak M.D.   On: 08/25/2019 14:16    Procedures Procedures (including critical care time)  Medications Ordered in ED Medications  ketorolac (TORADOL) 15 MG/ML injection 15 mg (15 mg Intravenous Given 08/25/19 1335)  acetaminophen (TYLENOL) tablet 1,000 mg (1,000 mg Oral Given 08/25/19 1335)  morphine 4 MG/ML injection 8 mg (8 mg Intravenous Given 08/25/19 1448)     Initial Impression / Assessment and Plan / ED Course  I have reviewed the triage vital signs and the nursing notes.  Pertinent labs & imaging results that were available during my  care of the patient were reviewed by me and considered in my medical decision making (see chart for details).        3:42 PM Patient tolerated placement in a cam walker without complication, had a substantial improvement in his pain level. Patient did require single dose of narcotics, which he initially was hesitant to take due to his history. Patient amenable to discharge with ongoing anti-inflammatories, Tylenol, muscle relaxant, again with  thought of avoiding additional narcotics given his history. Without evidence for distal involvement, after evaluation by our orthopedic physician assistant, placement of CAM Walker, provision of crutches, the patient was discharged in stable condition. Final Clinical Impressions(s) / ED Diagnoses   Final diagnoses:  Closed fracture of left foot, initial encounter    ED Discharge Orders         Ordered    tizanidine (ZANAFLEX) 2 MG capsule  3 times daily     08/25/19 1546    ibuprofen (ADVIL) 800 MG tablet  3 times daily     08/25/19 1546           Gerhard Munch, MD 08/26/19 [phone]

## 2019-08-25 NOTE — ED Triage Notes (Signed)
Per [facility], pt from work after tripping over a metal wire and injuring his left foot. No open injury. CMS intact. Pedal pulse present. In obvious pain.

## 2019-08-25 NOTE — ED Notes (Signed)
Patient verbalizes understanding of discharge instructions. Opportunity for questioning and answers were provided. Armband removed by staff, pt discharged from ED in wheelchair to home on crutches.

## 2019-08-25 NOTE — Progress Notes (Signed)
Orthopedic Tech Progress Note Patient Details:  Kenneth Brooks [birth year] [id number]  Ortho Devices Type of Ortho Device: CAM walker Ortho Device/Splint Location: LLE Ortho Device/Splint Interventions: Adjustment, Application, Ordered   Post Interventions Patient Tolerated: Well Instructions Provided: Care of device, Adjustment of device   Janit Pagan 08/25/2019, 3:15 PM

## 2019-08-25 NOTE — Discharge Instructions (Addendum)
You may put as much weight on your left foot as it comfortable as long as the boot is on.  You only need to have the boot on if you're putting weight down.  Keeping your foot elevated and using ice for 20 minutes 4x/day will help with the pain and swelling.  Get help right away if you have: Any of the following in your toes or your foot, even after loosening your splint (if applicable): Numbness. Tingling. Coldness. Blue skin. Redness or swelling that gets worse. Pain that suddenly becomes severe.

## 2019-08-26 ENCOUNTER — Ambulatory Visit: Payer: Self-pay | Admitting: Family Medicine

## 2019-09-03 ENCOUNTER — Emergency Department (HOSPITAL_COMMUNITY)
Admission: EM | Admit: 2019-09-03 | Discharge: 2019-09-03 | Disposition: A | Discharge status: Home / Self Care | Payer: PRIVATE HEALTH INSURANCE | Attending: Emergency Medicine | Admitting: Emergency Medicine

## 2019-09-03 ENCOUNTER — Other Ambulatory Visit: Payer: Self-pay

## 2019-09-03 ENCOUNTER — Encounter (HOSPITAL_COMMUNITY): Payer: Self-pay | Admitting: Emergency Medicine

## 2019-09-03 DIAGNOSIS — M79605 Pain in left leg: Secondary | ICD-10-CM | POA: Insufficient documentation

## 2019-09-03 DIAGNOSIS — F17210 Nicotine dependence, cigarettes, uncomplicated: Secondary | ICD-10-CM | POA: Insufficient documentation

## 2019-09-03 LAB — URINALYSIS, ROUTINE W REFLEX MICROSCOPIC
Bacteria, UA: NONE SEEN
Bilirubin Urine: NEGATIVE
Glucose, UA: NEGATIVE mg/dL
Hgb urine dipstick: NEGATIVE
Ketones, ur: NEGATIVE mg/dL
Nitrite: NEGATIVE
Protein, ur: NEGATIVE mg/dL
Specific Gravity, Urine: 1.018 (ref 1.005–1.030)
pH: 6 (ref 5.0–8.0)

## 2019-09-03 LAB — RAPID URINE DRUG SCREEN, HOSP PERFORMED
Amphetamines: NOT DETECTED
Barbiturates: NOT DETECTED
Benzodiazepines: NOT DETECTED
Cocaine: NOT DETECTED
Opiates: NOT DETECTED
Tetrahydrocannabinol: NOT DETECTED

## 2019-09-03 MED ORDER — PREDNISONE 10 MG PO TABS: 20.0000 mg | ORAL_TABLET | Freq: Every day | ORAL | 0 refills | Status: DC

## 2019-09-03 MED ORDER — PREDNISONE 10 MG (21) PO TBPK: ORAL_TABLET | Freq: Every day | ORAL | 0 refills | Status: DC

## 2019-09-03 MED ORDER — CYCLOBENZAPRINE HCL 10 MG PO TABS: 10.0000 mg | ORAL_TABLET | Freq: Two times a day (BID) | ORAL | 0 refills | Status: DC | PRN

## 2019-09-03 NOTE — ED Provider Notes (Signed)
[name] EMERGENCY DEPARTMENT Provider Note   CSN: [id number] Arrival date & time: 09/03/19  1137     History   Chief Complaint Chief Complaint  Patient presents with  . Sciatica    HPI Kenneth Brooks is a 43 y.o. male history of sciatica and remote IVDU presents with 4 days of constant worsened left leg pain that is similar to prior episodes of sciatica worse with movement and accompanied by sharp pain that radiates down to toes.   Patient states he broke his foot on the 22nd of this month.  Patient states he has history of IV drug use but has not used in 5 years and actively more number of who is a drug user.  Methadone use for recovery no other drug use.  Patient denies any bowel or bladder incontinence, saddle anesthesia, sensation changes, difficulty walking, dizziness, fevers, chills, night sweats, or weight changes.  Denies any difficulty initiating urine stream.  Patient denies any midline back tenderness or pain.     HPI  Past Medical History:  Diagnosis Date  . Anemia   . Anxiety   . Cirrhosis (HCC)   . Depression   . Hepatitis C   . Meningitis    spinal  . Neuromuscular disorder (HCC)    siactic nerve probleme  . Panic attack   . Substance abuse [facility])    former heroin user    Patient Active Problem List   Diagnosis Date Noted  . Substance abuse (HCC) 01/28/2018  . Cirrhosis of liver (HCC) 07/31/2017  . Chronic hepatitis C without hepatic coma (HCC) 07/01/2017    Past Surgical History:  Procedure Laterality Date  . Spinal Tap    . WISDOM TOOTH EXTRACTION          Home Medications    Prior to Admission medications   Medication Sig Start Date End Date Taking? Authorizing Provider  amLODipine (NORVASC) 10 MG tablet Take 1 tablet (10 mg total) by mouth daily. 07/22/19   Anders Simmonds, PA-C  famotidine (PEPCID) 20 MG tablet Take 1 tablet (20 mg total) by mouth 2 (two) times daily. 07/09/19   Fawze, Mina A, PA-C  gabapentin  (NEURONTIN) 300 MG capsule Take 1 capsule (300 mg total) by mouth 3 (three) times daily. Patient not taking: Reported on 06/12/2019 06/28/17   Ofilia Neas, PA-C  ibuprofen (ADVIL) 800 MG tablet Take 1 tablet (800 mg total) by mouth 3 (three) times daily. 08/25/19   Arthor Captain, PA-C  meloxicam (MOBIC) 15 MG tablet Take 1 tablet (15 mg total) by mouth daily. Patient not taking: Reported on 07/22/2019 07/09/19   Michela Pitcher A, PA-C  methadone (DOLOPHINE) 10 MG/ML solution Take 100 mg by mouth daily.    [provider]  tizanidine (ZANAFLEX) 2 MG capsule Take 1 capsule (2 mg total) by mouth 3 (three) times daily. 08/25/19   Arthor Captain, PA-C    Family History Family History  Problem Relation Age of Onset  . Cirrhosis Mother   . Liver cancer Mother   . Hypertension Father   . Stroke Father 83  . Heart attack Father   . Crohn's disease Brother   . Cancer Maternal Grandmother        smoker    Social History Social History   Tobacco Use  . Smoking status: Current Every Day Smoker    Packs/day: 0.50    Types: Cigarettes    Start date: 12/04/1987  . Smokeless tobacco: Never Used  Substance Use Topics  . Alcohol use: Not Currently    Frequency: Never  . Drug use: No    Comment: former heroin user",off for 2 years now".     Allergies   Bee venom   Review of Systems Review of Systems  Constitutional: Negative for chills and fever.  HENT: Negative for congestion.   Eyes: Negative for pain.  Respiratory: Negative for cough and shortness of breath.   Cardiovascular: Negative for chest pain and leg swelling.  Gastrointestinal: Negative for abdominal pain and vomiting.  Genitourinary: Negative for dysuria.  Musculoskeletal: Negative for myalgias.       Leg pain   Skin: Negative for rash.  Neurological: Negative for dizziness and headaches.     Physical Exam Updated Vital Signs BP (!) 133/97 (BP Location: Right Arm)   Pulse 74   Temp 98.2 F (36.8 C) (Oral)    Resp 20   SpO2 96%   Physical Exam Vitals signs and nursing note reviewed.  Constitutional:      General: He is not in acute distress. HENT:     Head: Normocephalic and atraumatic.     Nose: Nose normal.     Mouth/Throat:     Mouth: Mucous membranes are moist.  Eyes:     General: No scleral icterus. Neck:     Musculoskeletal: Normal range of motion.  Cardiovascular:     Rate and Rhythm: Normal rate and regular rhythm.     Pulses: Normal pulses.     Heart sounds: Normal heart sounds. No murmur. No friction rub. No gallop.   Pulmonary:     Effort: Pulmonary effort is normal. No respiratory distress.     Breath sounds: No wheezing.  Abdominal:     Palpations: Abdomen is soft.     Tenderness: There is no abdominal tenderness.  Musculoskeletal:     Right lower leg: No edema.     Left lower leg: No edema.     Comments: Left leg in a walking boot.  Pulses intact.  Able to flex and extend knee, hip with intact bilateral abduction, abduction, flexion and extension are equal.  Left sided gluteal pain on palpation over large muscle group.   No midline tenderness over lumbar or thoracic or cervical spine.  No sacral pain.  FROM of spine with twisting and leaning forward.  Skin:    General: Skin is warm and dry.     Capillary Refill: Capillary refill takes less than 2 seconds.     Comments: No track marks on arms no rash  Neurological:     Mental Status: He is alert. Mental status is at baseline.     Motor: No weakness.     Coordination: Coordination normal.     Deep Tendon Reflexes: Reflexes normal.     Comments: Positive left-sided straight leg raise  Psychiatric:        Mood and Affect: Mood normal.        Behavior: Behavior normal.      ED Treatments / Results  Labs (all labs ordered are listed, but only abnormal results are displayed) Labs Reviewed - No data to display  EKG None  Radiology No results found.  Procedures Procedures (including critical care  time)  Medications Ordered in ED Medications - No data to display   Initial Impression / Assessment and Plan / ED Course  I have reviewed the triage vital signs and the nursing notes.  Pertinent labs & imaging results that were available during  my care of the patient were reviewed by me and considered in my medical decision making (see chart for details).        Patient is 43 year old otherwise healthy male with history of IV drug use and sciatica presenting for gradual onset left-sided sciatic leg pain without back pain.  Considered spinal epidural abscess due to remote IV drug use however patient is no longer using, seems reputable source of information, has no neurologic deficits and no fever. Discussed concern with patient and patient states he has his temperature activity at work and will monitor for fever or any changes in his symptoms that concern him.  Patient states he will immediately return to ED if he experiences anything other than normal sciatic pain.  Patient has concerning for infection.  Patient was able to void without any difficulty.  This is reassuring.  Doubt cauda equina syndrome, spinal epidural abscess, spinal stenosis.  We will treat as exacerbation of chronic sciatica patient prescribed prednisone for sciatic pain as well as flexeril.  Recommend use of Tylenol ibuprofen for continued pain during the day.  Recommended stretching and gentle exercise.      The patient appears reasonably screened and/or stabilized for discharge and I doubt any other medical condition or other [facility] requiring further screening, evaluation, or treatment in the ED at this time prior to discharge.  Patient is hemodynamically stable, in NAD, and able to ambulate in the ED. Pain has been managed or a plan has been made for home management and has no complaints prior to discharge. Patient is comfortable with above plan and is stable for discharge at this time. All questions were answered prior  to disposition. Results from the ER workup discussed with the patient face to face and all questions answered to the best of my ability. The patient is safe for discharge with strict return precautions. Patient appears safe for discharge with appropriate follow-up.  Conveyed my impression with the patient and he voiced understanding and is agreeable to plan.   An After Visit Summary was printed and given to the patient.  Portions of this note were generated with [employer]. Dictation errors may occur despite best attempts at proofreading.     Final Clinical Impressions(s) / ED Diagnoses   Final diagnoses:  None    ED Discharge Orders    None       Tedd Sias, [place] 09/03/19 2026    Davonna Belling, MD 09/11/19 [phone]

## 2019-09-03 NOTE — ED Notes (Signed)
Patient verbalizes understanding of discharge instructions. Opportunity for questioning and answers were provided. Armband removed by staff, pt discharged from ED.  

## 2019-09-03 NOTE — ED Triage Notes (Signed)
Pt arrives to ED from home with complaints of his sciatica flaring up for there last three to four days.

## 2019-09-03 NOTE — Discharge Instructions (Addendum)
Sciatica  Follow up with your orthopedic appointment on the 5th. Please keep this appointment and mention your sciatica.   I have prescribed flexeril a muscle relaxant.  Please keep in mind that muscle relaxers can cause fatigue and should not be taken while at work or driving.  Back pain is very common.  The pain often gets better over time.  The cause of back pain is usually not dangerous.  Most people can learn to manage their back pain on their own.  However if you develop severe or worsening pain, low back pain with fever, numbness, weakness or inability to walk or urinate, you should return to the ER immediately.  Please follow up with your doctor this week for a recheck if still having symptoms.  Low back pain is discomfort in the lower back that may be due to injuries to muscles and ligaments around the spine.  Occasionally, it may be caused by a a problem to a part of the spine called a disc. The pain may last several days or a week;  However, most patients get completely well in 4 weeks.  Medications are also useful to help with pain control.  A commonly prescribed medications includes acetaminophen.  This medication is generally safe, though you should not take more than 8 of the extra strength (500mg ) pills a day.  Non steroidal anti inflammatory medications including Ibuprofen and naproxen;  These medications help both pain and swelling and are very useful in treating back pain.  They should be taken with food, as they can cause stomach upset, and more seriously, stomach bleeding.    You will need to follow up with  Your primary healthcare provider in 1-2 weeks for reassessment.   Be aware that if you develop new symptoms, such as a fever, leg weakness, difficulty with or loss of control of your urine or bowels, abdominal pain, or more severe pain, you will need to seek medical attention and  / or return to the Emergency department.    Home Care Stay active.  Start with short walks on  flat ground if you can.  Try to walk farther each day. Do not sit, drive or stand in one place for more than 30 minutes.  Do not stay in bed. Do not avoid exercise or work.  Activity can help your back heal faster. Be careful when you bend or lift an object.  Bend at your knees, keep the object close to you, and do not twist. Sleep on a firm mattress.  Lie on your side, and bend your knees.  If you lie on your back, put a pillow under your knees. Only take medicines as told by your doctor. Put ice on the injured area. Put ice in a plastic bag Place a towel between your skin and the bag Leave the ice on for 15-20 minutes, 3-4 times a day for the first 2-3 days. 210 After that, you can switch between ice and heat packs. Ask your doctor about back exercises or massage. Avoid feeling anxious or stressed.  Find good ways to deal with stress, such as exercise.  Get Help Right Way If: Your pain does not go away with rest or medicine. Your pain does not go away in 1 week. You have new problems. You do not feel well. The pain spreads into your legs. You cannot control when you poop (bowel movement) or pee (urinate) You feel sick to your stomach (nauseous) or throw up (vomit) You have belly (  abdominal) pain. You feel like you may pass out (faint). If you develop a fever.  Make Sure you: Understand these instructions. Will watch your condition Will get help right away if you are not doing well or get worse.

## 2019-10-01 ENCOUNTER — Telehealth: Payer: Self-pay | Admitting: *Deleted

## 2019-10-01 ENCOUNTER — Ambulatory Visit: Payer: PRIVATE HEALTH INSURANCE | Admitting: Internal Medicine

## 2019-10-01 NOTE — Telephone Encounter (Signed)
Called patient regarding his appointment today. He states he is going to reschedule. He has a lot going on with his foot at this time.

## 2019-12-08 ENCOUNTER — Ambulatory Visit: Payer: Self-pay | Admitting: Internal Medicine

## 2019-12-10 ENCOUNTER — Telehealth: Payer: BC Managed Care – PPO | Admitting: Emergency Medicine

## 2019-12-10 DIAGNOSIS — K0889 Other specified disorders of teeth and supporting structures: Secondary | ICD-10-CM

## 2019-12-10 MED ORDER — CLINDAMYCIN HCL 150 MG PO CAPS: 450.0000 mg | ORAL_CAPSULE | Freq: Three times a day (TID) | ORAL | 0 refills | Status: AC

## 2019-12-10 NOTE — Progress Notes (Signed)
Time spent: 5 min

## 2019-12-10 NOTE — Progress Notes (Signed)
Time spent: 5 min  44 yo M with chief complaint of mouth and dental pain. Reports history of poor dentition and several missing teeth, he is concerned about infection. Does not report any red flag symptoms.   Highest on ddx is dentin/nerve exposure from cracked teeth causing nerve irritation vs infection.  Will treat with antibiotic, NSAIDs, warm salt soaks.  Encouraged patient to follow-up with dentist.Follow up precautions given.

## 2020-01-04 DIAGNOSIS — I10 Essential (primary) hypertension: Secondary | ICD-10-CM | POA: Diagnosis not present

## 2020-01-06 ENCOUNTER — Encounter: Payer: Self-pay | Admitting: Registered Nurse

## 2020-01-28 DIAGNOSIS — G4719 Other hypersomnia: Secondary | ICD-10-CM | POA: Diagnosis not present

## 2020-01-28 DIAGNOSIS — R110 Nausea: Secondary | ICD-10-CM | POA: Diagnosis not present

## 2020-01-28 DIAGNOSIS — Z79899 Other long term (current) drug therapy: Secondary | ICD-10-CM | POA: Diagnosis not present

## 2020-01-28 DIAGNOSIS — I10 Essential (primary) hypertension: Secondary | ICD-10-CM | POA: Diagnosis not present

## 2020-03-22 DIAGNOSIS — S39012A Strain of muscle, fascia and tendon of lower back, initial encounter: Secondary | ICD-10-CM | POA: Diagnosis not present

## 2020-08-09 DIAGNOSIS — Z91030 Bee allergy status: Secondary | ICD-10-CM | POA: Diagnosis not present

## 2020-08-09 DIAGNOSIS — R0789 Other chest pain: Secondary | ICD-10-CM | POA: Diagnosis not present

## 2020-08-09 DIAGNOSIS — M940 Chondrocostal junction syndrome [Tietze]: Secondary | ICD-10-CM | POA: Diagnosis not present

## 2020-09-12 DIAGNOSIS — F1120 Opioid dependence, uncomplicated: Secondary | ICD-10-CM | POA: Diagnosis not present

## 2020-09-13 DIAGNOSIS — F1120 Opioid dependence, uncomplicated: Secondary | ICD-10-CM | POA: Diagnosis not present

## 2020-09-14 DIAGNOSIS — F1120 Opioid dependence, uncomplicated: Secondary | ICD-10-CM | POA: Diagnosis not present

## 2020-09-15 DIAGNOSIS — F1120 Opioid dependence, uncomplicated: Secondary | ICD-10-CM | POA: Diagnosis not present

## 2020-09-16 DIAGNOSIS — F1120 Opioid dependence, uncomplicated: Secondary | ICD-10-CM | POA: Diagnosis not present

## 2020-09-17 DIAGNOSIS — F1120 Opioid dependence, uncomplicated: Secondary | ICD-10-CM | POA: Diagnosis not present

## 2020-09-18 DIAGNOSIS — F1120 Opioid dependence, uncomplicated: Secondary | ICD-10-CM | POA: Diagnosis not present

## 2020-09-19 DIAGNOSIS — F1120 Opioid dependence, uncomplicated: Secondary | ICD-10-CM | POA: Diagnosis not present

## 2020-09-20 DIAGNOSIS — F1120 Opioid dependence, uncomplicated: Secondary | ICD-10-CM | POA: Diagnosis not present

## 2020-09-21 DIAGNOSIS — F1120 Opioid dependence, uncomplicated: Secondary | ICD-10-CM | POA: Diagnosis not present

## 2020-09-22 DIAGNOSIS — F1120 Opioid dependence, uncomplicated: Secondary | ICD-10-CM | POA: Diagnosis not present

## 2020-09-23 DIAGNOSIS — F1120 Opioid dependence, uncomplicated: Secondary | ICD-10-CM | POA: Diagnosis not present

## 2020-09-24 DIAGNOSIS — F1120 Opioid dependence, uncomplicated: Secondary | ICD-10-CM | POA: Diagnosis not present

## 2020-09-25 DIAGNOSIS — F1120 Opioid dependence, uncomplicated: Secondary | ICD-10-CM | POA: Diagnosis not present

## 2021-11-21 ENCOUNTER — Emergency Department (HOSPITAL_COMMUNITY)
Admission: EM | Admit: 2021-11-21 | Discharge: 2021-11-21 | Disposition: A | Discharge status: Home / Self Care | Payer: BC Managed Care – PPO | Attending: Emergency Medicine | Admitting: Emergency Medicine

## 2021-11-21 ENCOUNTER — Encounter (HOSPITAL_COMMUNITY): Payer: Self-pay | Admitting: Emergency Medicine

## 2021-11-21 ENCOUNTER — Other Ambulatory Visit: Payer: Self-pay

## 2021-11-21 ENCOUNTER — Emergency Department (HOSPITAL_COMMUNITY): Payer: BC Managed Care – PPO

## 2021-11-21 DIAGNOSIS — Z79899 Other long term (current) drug therapy: Secondary | ICD-10-CM | POA: Insufficient documentation

## 2021-11-21 DIAGNOSIS — F17210 Nicotine dependence, cigarettes, uncomplicated: Secondary | ICD-10-CM | POA: Diagnosis not present

## 2021-11-21 DIAGNOSIS — Z20822 Contact with and (suspected) exposure to covid-19: Secondary | ICD-10-CM | POA: Diagnosis not present

## 2021-11-21 DIAGNOSIS — R0789 Other chest pain: Secondary | ICD-10-CM | POA: Insufficient documentation

## 2021-11-21 DIAGNOSIS — R519 Headache, unspecified: Secondary | ICD-10-CM | POA: Diagnosis present

## 2021-11-21 DIAGNOSIS — B349 Viral infection, unspecified: Secondary | ICD-10-CM | POA: Diagnosis not present

## 2021-11-21 DIAGNOSIS — R531 Weakness: Secondary | ICD-10-CM | POA: Diagnosis not present

## 2021-11-21 LAB — CBC
HCT: 46.6 % (ref 39.0–52.0)
Hemoglobin: 15.7 g/dL (ref 13.0–17.0)
MCH: 30 pg (ref 26.0–34.0)
MCHC: 33.7 g/dL (ref 30.0–36.0)
MCV: 88.9 fL (ref 80.0–100.0)
Platelets: 227 10*3/uL (ref 150–400)
RBC: 5.24 MIL/uL (ref 4.22–5.81)
RDW: 11.9 % (ref 11.5–15.5)
WBC: 9.2 10*3/uL (ref 4.0–10.5)
nRBC: 0 % (ref 0.0–0.2)

## 2021-11-21 LAB — BASIC METABOLIC PANEL
Anion gap: 9 (ref 5–15)
BUN: 18 mg/dL (ref 6–20)
CO2: 26 mmol/L (ref 22–32)
Calcium: 9.2 mg/dL (ref 8.9–10.3)
Chloride: 98 mmol/L (ref 98–111)
Creatinine, Ser: 0.86 mg/dL (ref 0.61–1.24)
GFR, Estimated: >60 mL/min (ref >60)
Glucose, Bld: 128 mg/dL — ABNORMAL HIGH (ref 70–99)
Potassium: 3.4 mmol/L — ABNORMAL LOW (ref 3.5–5.1)
Sodium: 133 mmol/L — ABNORMAL LOW (ref 135–145)

## 2021-11-21 LAB — RESP PANEL BY RT-PCR (FLU A&B, COVID) ARPGX2
Influenza A by PCR: NEGATIVE
Influenza B by PCR: NEGATIVE
SARS Coronavirus 2 by RT PCR: NEGATIVE

## 2021-11-21 LAB — TROPONIN I (HIGH SENSITIVITY): Troponin I (High Sensitivity): 3 ng/L (ref <18)

## 2021-11-21 NOTE — ED Triage Notes (Signed)
Patient here with chest pain and states that he has been feeling weak for the last 4 days.  He denies any cough or fevers , but states he did have the sniffles.

## 2021-11-21 NOTE — Discharge Instructions (Signed)
Please follow-up on your COVID and influenza testing results, which should be available later today.  You can log online to see your results.  If you are positive for either of these, you should quarantine for 7 days from when your symptoms started.  A work note was provided.  Continue drinking plenty of fluids and using over-the-counter cold and flu type medications for your symptoms.

## 2021-11-21 NOTE — ED Provider Notes (Signed)
[facility] EMERGENCY DEPARTMENT Provider Note   CSN: [id number] Arrival date & time: 11/21/21  [id number]     History Chief Complaint  Patient presents with   Chest Pain   Weakness    Kenneth Brooks is a 45 y.o. male presenting the emergency department with several symptoms.  Patient reports onset of symptoms 5 days ago on Thursday, reporting congestion, cough, headaches, poor appetite, stomach pain cramping, denying diarrhea.  He reports he did have some chest pressure that began 2 or 3 days ago and has been intermittent, which he has never had before.  He reports he was a former 30-year smoker, but quit 3 months ago.  He denies any history of MI or cardiac disease.  HPI     Past Medical History:  Diagnosis Date   Anemia    Anxiety    Cirrhosis ([place])    Depression    Hepatitis C    Meningitis    spinal   Neuromuscular disorder ([place])    siactic nerve probleme   Panic attack    Substance abuse ([place])    former heroin user    Patient Active Problem List   Diagnosis Date Noted   Substance abuse ([place]) 01/28/2018   Cirrhosis of liver ([place]) 07/31/2017   Chronic hepatitis C without hepatic coma ([place]) 07/01/2017    Past Surgical History:  Procedure Laterality Date   Spinal Tap     WISDOM TOOTH EXTRACTION         Family History  Problem Relation Age of Onset   Cirrhosis Mother    Liver cancer Mother    Hypertension Father    Stroke Father 4   Heart attack Father    Crohn's disease Brother    Cancer Maternal Grandmother        smoker    Social History   Tobacco Use   Smoking status: Every Day    Packs/day: 0.50    Types: Cigarettes    Start date: 12/04/1987   Smokeless tobacco: Never  Vaping Use   Vaping Use: Every day  Substance Use Topics   Alcohol use: Not Currently   Drug use: No    Comment: former heroin user",off for 2 years now".    Home Medications Prior to Admission medications   Medication Sig Start Date End Date Taking?  Authorizing Provider  amLODipine (NORVASC) 10 MG tablet Take 1 tablet (10 mg total) by mouth daily. 07/22/19   Argentina Donovan, PA-C  cyclobenzaprine (FLEXERIL) 10 MG tablet Take 1 tablet (10 mg total) by mouth 2 (two) times daily as needed for muscle spasms. 09/03/19   Tedd Sias, PA  famotidine (PEPCID) 20 MG tablet Take 1 tablet (20 mg total) by mouth 2 (two) times daily. 07/09/19   Fawze, Mina A, PA-C  gabapentin (NEURONTIN) 300 MG capsule Take 1 capsule (300 mg total) by mouth 3 (three) times daily. Patient not taking: Reported on 06/12/2019 06/28/17   Tereasa Coop, PA-C  ibuprofen (ADVIL) 800 MG tablet Take 1 tablet (800 mg total) by mouth 3 (three) times daily. 08/25/19   Margarita Mail, PA-C  meloxicam (MOBIC) 15 MG tablet Take 1 tablet (15 mg total) by mouth daily. Patient not taking: Reported on 07/22/2019 07/09/19   Rodell Perna A, PA-C  methadone (DOLOPHINE) 10 MG/ML solution Take 100 mg by mouth daily.    [provider]  predniSONE (STERAPRED UNI-PAK 21 TAB) 10 MG (21) TBPK tablet Take by mouth daily. Take 6  tabs by mouth daily  for 2 days, then 5 tabs for 2 days, then 4 tabs for 2 days, then 3 tabs for 2 days, 2 tabs for 2 days, then 1 tab by mouth daily for 2 days 09/03/19   Pati Gallo S, PA  tizanidine (ZANAFLEX) 2 MG capsule Take 1 capsule (2 mg total) by mouth 3 (three) times daily. 08/25/19   Margarita Mail, PA-C    Allergies    Bee venom  Review of Systems   Review of Systems  Constitutional:  Negative for chills and fever.  HENT:  Negative for ear pain and sore throat.   Eyes:  Negative for pain and visual disturbance.  Respiratory:  Negative for cough and shortness of breath.   Cardiovascular:  Positive for chest pain and palpitations.  Gastrointestinal:  Negative for abdominal pain and vomiting.  Genitourinary:  Negative for dysuria and hematuria.  Musculoskeletal:  Positive for arthralgias and myalgias.  Skin:  Negative for color change and rash.   Neurological:  Positive for headaches. Negative for syncope.  All other systems reviewed and are negative.  Physical Exam Updated Vital Signs BP 135/85    Pulse 63    Temp 98.3 F (36.8 C) (Oral)    Resp 18    SpO2 94%   Physical Exam Vitals and nursing note reviewed.  Constitutional:      General: He is not in acute distress.    Appearance: He is well-developed.  HENT:     Head: Normocephalic and atraumatic.  Eyes:     Conjunctiva/sclera: Conjunctivae normal.     Pupils: Pupils are equal, round, and reactive to light.  Cardiovascular:     Rate and Rhythm: Normal rate and regular rhythm.     Heart sounds: No murmur heard. Pulmonary:     Effort: Pulmonary effort is normal. No respiratory distress.     Breath sounds: Normal breath sounds.  Abdominal:     General: There is no distension.     Palpations: Abdomen is soft.     Tenderness: There is no abdominal tenderness.  Musculoskeletal:        General: No swelling.     Cervical back: Neck supple.  Skin:    General: Skin is warm and dry.     Capillary Refill: Capillary refill takes less than 2 seconds.  Neurological:     General: No focal deficit present.     Mental Status: He is alert. Mental status is at baseline.  Psychiatric:        Mood and Affect: Mood normal.        Behavior: Behavior normal.    ED Results / Procedures / Treatments   Labs (all labs ordered are listed, but only abnormal results are displayed) Labs Reviewed  BASIC METABOLIC PANEL - Abnormal; Notable for the following components:      Result Value   Sodium 133 (*)    Potassium 3.4 (*)    Glucose, Bld 128 (*)    All other components within normal limits  RESP PANEL BY RT-PCR (FLU A&B, COVID) ARPGX2  CBC  TROPONIN I (HIGH SENSITIVITY)    EKG EKG Interpretation  Date/Time:  Tuesday November 21 2021 07:00:39 EST Ventricular Rate:  68 PR Interval:  150 QRS Duration: 94 QT Interval:  352 QTC Calculation: 374 R Axis:   -64 Text  Interpretation: Normal sinus rhythm Left anterior fascicular block Confirmed by Octaviano Glow [id number]) on 11/21/2021 11:52:03 AM  Radiology DG Chest 2 View  Result Date: 11/21/2021 CLINICAL DATA:  Chest pain. EXAM: CHEST - 2 VIEW COMPARISON:  Chest XR, 07/11/2017 and 06/28/2017. FINDINGS: Cardiomediastinal silhouette is within normal limits. Lungs are well inflated. No focal consolidation or mass. No pleural effusion or pneumothorax. No acute displaced fracture. IMPRESSION: Normal chest Electronically Signed   By: Roanna Banning M.D.   On: 11/21/2021 07:40    Procedures Procedures   Medications Ordered in ED Medications - No data to display  ED Course  I have reviewed the triage vital signs and the nursing notes.  Pertinent labs & imaging results that were available during my care of the patient were reviewed by me and considered in my medical decision making (see chart for details).  Patient is presenting with what I strongly suspect is a viral syndrome.  COVID and flu test will be sent.  He is clinically well-appearing, no evidence of sepsis.  Doubt meningitis.  I personally reviewed and interpreted his work-up including chest x-ray and blood test.  X-ray does not show signs of pneumothorax or collapsed lung.  Blood test including troponin level are unremarkable.  EKG shows sinus rhythm not acute ischemic findings.  I think his chest pain would be highly atypical for ACS, more likely related to chest congestion, or bronchitis, or also related to his viral illness.  I advised continued supportive care through the week, over-the-counter medications, plenty of fluids.  A work note was provided as a precaution for 7 days quarantine   Final Clinical Impression(s) / ED Diagnoses Final diagnoses:  Viral illness    Rx / DC Orders ED Discharge Orders     None        Cris Gibby, Kermit Balo, MD 11/21/21 [phone]

## 2021-11-23 ENCOUNTER — Encounter (HOSPITAL_COMMUNITY): Payer: Self-pay

## 2021-11-23 ENCOUNTER — Emergency Department (HOSPITAL_COMMUNITY)
Admission: EM | Admit: 2021-11-23 | Discharge: 2021-11-23 | Disposition: A | Discharge status: Home / Self Care | Payer: BC Managed Care – PPO | Source: Home / Self Care | Attending: Emergency Medicine | Admitting: Emergency Medicine

## 2021-11-23 ENCOUNTER — Emergency Department (HOSPITAL_COMMUNITY): Payer: BC Managed Care – PPO

## 2021-11-23 ENCOUNTER — Emergency Department (HOSPITAL_COMMUNITY)
Admission: EM | Admit: 2021-11-23 | Discharge: 2021-11-23 | Disposition: A | Discharge status: Home / Self Care | Payer: BC Managed Care – PPO | Attending: Emergency Medicine | Admitting: Emergency Medicine

## 2021-11-23 ENCOUNTER — Other Ambulatory Visit: Payer: Self-pay

## 2021-11-23 DIAGNOSIS — F17210 Nicotine dependence, cigarettes, uncomplicated: Secondary | ICD-10-CM | POA: Insufficient documentation

## 2021-11-23 DIAGNOSIS — F419 Anxiety disorder, unspecified: Secondary | ICD-10-CM | POA: Insufficient documentation

## 2021-11-23 DIAGNOSIS — R110 Nausea: Secondary | ICD-10-CM | POA: Diagnosis not present

## 2021-11-23 DIAGNOSIS — R1013 Epigastric pain: Secondary | ICD-10-CM | POA: Diagnosis present

## 2021-11-23 LAB — CBC WITH DIFFERENTIAL/PLATELET
Abs Immature Granulocytes: 0.02 10*3/uL (ref 0.00–0.07)
Basophils Absolute: 0 10*3/uL (ref 0.0–0.1)
Basophils Relative: 1 %
Eosinophils Absolute: 0 10*3/uL (ref 0.0–0.5)
Eosinophils Relative: 0 %
HCT: 48.7 % (ref 39.0–52.0)
Hemoglobin: 15.8 g/dL (ref 13.0–17.0)
Immature Granulocytes: 0 %
Lymphocytes Relative: 19 %
Lymphs Abs: 1.6 10*3/uL (ref 0.7–4.0)
MCH: 29.3 pg (ref 26.0–34.0)
MCHC: 32.4 g/dL (ref 30.0–36.0)
MCV: 90.4 fL (ref 80.0–100.0)
Monocytes Absolute: 0.6 10*3/uL (ref 0.1–1.0)
Monocytes Relative: 7 %
Neutro Abs: 6.4 10*3/uL (ref 1.7–7.7)
Neutrophils Relative %: 73 %
Platelets: 258 10*3/uL (ref 150–400)
RBC: 5.39 MIL/uL (ref 4.22–5.81)
RDW: 11.9 % (ref 11.5–15.5)
WBC: 8.7 10*3/uL (ref 4.0–10.5)
nRBC: 0 % (ref 0.0–0.2)

## 2021-11-23 LAB — BASIC METABOLIC PANEL
Anion gap: 8 (ref 5–15)
BUN: 14 mg/dL (ref 6–20)
CO2: 29 mmol/L (ref 22–32)
Calcium: 9.5 mg/dL (ref 8.9–10.3)
Chloride: 101 mmol/L (ref 98–111)
Creatinine, Ser: 0.95 mg/dL (ref 0.61–1.24)
GFR, Estimated: >60 mL/min (ref >60)
Glucose, Bld: 115 mg/dL — ABNORMAL HIGH (ref 70–99)
Potassium: 3.8 mmol/L (ref 3.5–5.1)
Sodium: 138 mmol/L (ref 135–145)

## 2021-11-23 MED ORDER — ONDANSETRON HCL 4 MG/2ML IJ SOLN
4.0000 mg | Freq: Once | INTRAMUSCULAR | Status: DC
  Filled 2021-11-23: qty 2

## 2021-11-23 MED ORDER — ALUM & MAG HYDROXIDE-SIMETH 200-200-20 MG/5ML PO SUSP
30.0000 mL | Freq: Once | ORAL | Status: AC
  Administered 2021-11-23: 13:00:00 30 mL via ORAL
  Filled 2021-11-23: qty 30

## 2021-11-23 MED ORDER — SODIUM CHLORIDE 0.9 % IV BOLUS: 500.0000 mL | Freq: Once | INTRAVENOUS | Status: DC

## 2021-11-23 MED ORDER — LIDOCAINE VISCOUS HCL 2 % MT SOLN
15.0000 mL | Freq: Once | OROMUCOSAL | Status: AC
  Administered 2021-11-23: 13:00:00 15 mL via ORAL
  Filled 2021-11-23: qty 15

## 2021-11-23 MED ORDER — PANTOPRAZOLE SODIUM 40 MG IV SOLR
40.0000 mg | Freq: Once | INTRAVENOUS | Status: DC
  Filled 2021-11-23: qty 40

## 2021-11-23 NOTE — ED Notes (Signed)
Pt was noted to have tremors in his hands & acting very anxious. He refused labs & insertion of PIV for fluids & meds. This RN asked if he was withdrawing & he replied "I don't want no Suboxone or nothing." EDP was made aware of pt's symptomatic appearance & what he said.

## 2021-11-23 NOTE — ED Provider Notes (Signed)
[facility] EMERGENCY DEPARTMENT Provider Note   CSN: [id number] Arrival date & time: 11/23/21  1100     History Chief Complaint  Patient presents with   Abdominal Pain   Nausea   Emesis    Kenneth Brooks is a 45 y.o. male.  HPI  45 year old male with past medical history of anemia, anxiety, substance abuse presents emergency department concern for epigastric discomfort.  Patient states has been going on for close to a week.  Was seen previously with a negative work-up, discharged home.  He states has been trying over-the-counter medication without any significant relief.  Patient presents today with ongoing epigastric discomfort that he describes as a gnawing sensation, associated with nausea.  Denies any fever, vomiting, diarrhea.  No genitourinary symptoms.  No previous surgeries in the abdomen.  Past Medical History:  Diagnosis Date   Anemia    Anxiety    Cirrhosis (HCC)    Depression    Hepatitis C    Meningitis    spinal   Neuromuscular disorder (HCC)    siactic nerve probleme   Panic attack    Substance abuse (HCC)    former heroin user    Patient Active Problem List   Diagnosis Date Noted   Substance abuse (HCC) 01/28/2018   Cirrhosis of liver (HCC) 07/31/2017   Chronic hepatitis C without hepatic coma (HCC) 07/01/2017    Past Surgical History:  Procedure Laterality Date   Spinal Tap     WISDOM TOOTH EXTRACTION         Family History  Problem Relation Age of Onset   Cirrhosis Mother    Liver cancer Mother    Hypertension Father    Stroke Father 71   Heart attack Father    Crohn's disease Brother    Cancer Maternal Grandmother        smoker    Social History   Tobacco Use   Smoking status: Every Day    Packs/day: 0.50    Types: Cigarettes    Start date: 12/04/1987   Smokeless tobacco: Never  Vaping Use   Vaping Use: Every day  Substance Use Topics   Alcohol use: Not Currently   Drug use: No    Comment: former  heroin user",off for 2 years now".    Home Medications Prior to Admission medications   Medication Sig Start Date End Date Taking? Authorizing Provider  amLODipine (NORVASC) 10 MG tablet Take 1 tablet (10 mg total) by mouth daily. Patient not taking: Reported on 11/23/2021 07/22/19   Anders Simmonds, PA-C  cyclobenzaprine (FLEXERIL) 10 MG tablet Take 1 tablet (10 mg total) by mouth 2 (two) times daily as needed for muscle spasms. Patient not taking: Reported on 11/23/2021 09/03/19   Gailen Shelter, PA  famotidine (PEPCID) 20 MG tablet Take 1 tablet (20 mg total) by mouth 2 (two) times daily. Patient not taking: Reported on 11/23/2021 07/09/19   Michela Pitcher A, PA-C  gabapentin (NEURONTIN) 300 MG capsule Take 1 capsule (300 mg total) by mouth 3 (three) times daily. Patient not taking: Reported on 06/12/2019 06/28/17   Ofilia Neas, PA-C  ibuprofen (ADVIL) 800 MG tablet Take 1 tablet (800 mg total) by mouth 3 (three) times daily. Patient not taking: Reported on 11/23/2021 08/25/19   Arthor Captain, PA-C  meloxicam (MOBIC) 15 MG tablet Take 1 tablet (15 mg total) by mouth daily. Patient not taking: Reported on 07/22/2019 07/09/19   Michela Pitcher A, PA-C  predniSONE (  STERAPRED UNI-PAK 21 TAB) 10 MG (21) TBPK tablet Take by mouth daily. Take 6 tabs by mouth daily  for 2 days, then 5 tabs for 2 days, then 4 tabs for 2 days, then 3 tabs for 2 days, 2 tabs for 2 days, then 1 tab by mouth daily for 2 days Patient not taking: Reported on 11/23/2021 09/03/19   Tedd Sias, PA  tizanidine (ZANAFLEX) 2 MG capsule Take 1 capsule (2 mg total) by mouth 3 (three) times daily. Patient not taking: Reported on 11/23/2021 08/25/19   Margarita Mail, PA-C    Allergies    Bee venom and Amlodipine  Review of Systems   Review of Systems  Constitutional:  Positive for appetite change. Negative for chills and fever.  HENT:  Negative for congestion.   Eyes:  Negative for visual disturbance.  Respiratory:   Negative for shortness of breath.   Cardiovascular:  Negative for chest pain.  Gastrointestinal:  Positive for abdominal pain and nausea. Negative for blood in stool, diarrhea and vomiting.  Genitourinary:  Negative for dysuria.  Skin:  Negative for rash.  Neurological:  Negative for headaches.   Physical Exam Updated Vital Signs BP (!) 158/91    Pulse 80    Temp 98.9 F (37.2 C) (Oral)    Resp 19    SpO2 97%   Physical Exam Vitals and nursing note reviewed.  Constitutional:      Appearance: Normal appearance.  HENT:     Head: Normocephalic.     Mouth/Throat:     Mouth: Mucous membranes are moist.  Cardiovascular:     Rate and Rhythm: Normal rate.  Pulmonary:     Effort: Pulmonary effort is normal. No respiratory distress.  Abdominal:     General: There is no distension.     Palpations: Abdomen is soft.     Tenderness: There is abdominal tenderness in the epigastric area. There is no guarding or rebound. Negative signs include Murphy's sign.  Skin:    General: Skin is warm.  Neurological:     Mental Status: He is alert and oriented to person, place, and time. Mental status is at baseline.  Psychiatric:        Mood and Affect: Mood normal.    ED Results / Procedures / Treatments   Labs (all labs ordered are listed, but only abnormal results are displayed) Labs Reviewed  BASIC METABOLIC PANEL - Abnormal; Notable for the following components:      Result Value   Glucose, Bld 115 (*)    All other components within normal limits  CBC WITH DIFFERENTIAL/PLATELET  HEPATIC FUNCTION PANEL  LIPASE, BLOOD    EKG None  Radiology No results found.  Procedures Procedures   Medications Ordered in ED Medications  sodium chloride 0.9 % bolus 500 mL (500 mLs Intravenous Not Given 11/23/21 1334)  ondansetron (ZOFRAN) injection 4 mg (4 mg Intravenous Not Given 11/23/21 1334)  pantoprazole (PROTONIX) injection 40 mg (40 mg Intravenous Not Given 11/23/21 1334)  alum & mag  hydroxide-simeth (MAALOX/MYLANTA) 200-200-20 MG/5ML suspension 30 mL (30 mLs Oral Given 11/23/21 1324)    And  lidocaine (XYLOCAINE) 2 % viscous mouth solution 15 mL (15 mLs Oral Given 11/23/21 1325)    ED Course  I have reviewed the triage vital signs and the nursing notes.  Pertinent labs & imaging results that were available during my care of the patient were reviewed by me and considered in my medical decision making (see chart for details).  MDM Rules/Calculators/A&P                           44 year old male presents emergency department with epigastric discomfort.  Vitals are stable on arrival, mild epigastric discomfort on palpation, negative Murphy's, no distended abdomen.  Vital signs are stable.  CBC and BMP are reassuring.  Added on hepatic panel and lipase, plan for right upper quadrant ultrasound and symptomatic treatment.  Nurse reported to me that the patient was refusing any further blood work, IV medications but did drink a GI cocktail with slight relief.  He revealed to the nurse that there is an aspect of withdrawal with his symptoms.  I was asked to reevaluate the patient, prior to me being able to reevaluate the patient he left the emergency department without further blood work/ultrasound.     Final Clinical Impression(s) / ED Diagnoses Final diagnoses:  Epigastric pain    Rx / DC Orders ED Discharge Orders     None        Lorelle Gibbs, DO 11/23/21 1427

## 2021-11-23 NOTE — ED Notes (Signed)
I went to give pt his D/C papers, but he was gone already.

## 2021-11-23 NOTE — ED Notes (Signed)
Pt left AMA °

## 2021-11-23 NOTE — ED Triage Notes (Signed)
Pt. Stated, Im still having stomach pain since 2 weeks, some nausea. I was here last week.

## 2021-11-23 NOTE — ED Triage Notes (Signed)
Pt seen for second time today, this time stating, "I have a bad feeling about something." But when questioned, he states he would rather not talk about it until he talks to his lawyer. Denies SI/HI or AVH.

## 2021-11-23 NOTE — ED Provider Notes (Signed)
Emergency Medicine Provider Triage Evaluation Note  Kenneth Brooks , a 45 y.o. male  was evaluated in triage.  Pt complains of abdominal pain.  Patient states has been going on for 1 week. Patient seen in this ED on 12/20 for same complaint and diagnosed with viral illness.  Patient returns with continuation of same symptoms that brought him here on 12/20.  Review of Systems  Positive: Nausea, vomiting, cough, abdominal pain Negative: Shortness of breath, chest pain, sore throat, fever, diarrhea  Physical Exam  BP (!) 156/99 (BP Location: Right Arm)    Pulse 91    Temp 98.9 F (37.2 C) (Oral)    Resp 18    SpO2 95%  Gen:   Awake, no distress   Resp:  Normal effort  MSK:   Moves extremities without difficulty  Other:  No tenderness, rebound, guarding on abdominal exam.  Medical Decision Making  Medically screening exam initiated at 11:18 AM.  Appropriate orders placed.  Kenneth Brooks was informed that the remainder of the evaluation will be completed by another provider, this initial triage assessment does not replace that evaluation, and the importance of remaining in the ED until their evaluation is complete.     Al Decant, PA-C 11/23/21 1120    Rozelle Logan, DO 11/23/21 1139

## 2021-11-23 NOTE — ED Provider Notes (Signed)
[facility] EMERGENCY DEPARTMENT Provider Note   CSN: [id number] Arrival date & time: 11/23/21  1535     History Chief Complaint  Patient presents with   Anxiety    Kenneth Brooks is a 45 y.o. male who presents back to the emergency department for the second time today.  Upon my evaluation, the patient states that he has no acute complaints he would like to discuss and would prefer to be discharged.  He denied SI/HI/AVH.    Past Medical History:  Diagnosis Date   Anemia    Anxiety    Cirrhosis ([place])    Depression    Hepatitis C    Meningitis    spinal   Neuromuscular disorder ([place])    siactic nerve probleme   Panic attack    Substance abuse ([place])    former heroin user    Patient Active Problem List   Diagnosis Date Noted   Substance abuse ([place]) 01/28/2018   Cirrhosis of liver ([place]) 07/31/2017   Chronic hepatitis C without hepatic coma ([place]) 07/01/2017    Past Surgical History:  Procedure Laterality Date   Spinal Tap     WISDOM TOOTH EXTRACTION         Family History  Problem Relation Age of Onset   Cirrhosis Mother    Liver cancer Mother    Hypertension Father    Stroke Father 56   Heart attack Father    Crohn's disease Brother    Cancer Maternal Grandmother        smoker    Social History   Tobacco Use   Smoking status: Every Day    Packs/day: 0.50    Types: Cigarettes    Start date: 12/04/1987   Smokeless tobacco: Never  Vaping Use   Vaping Use: Every day  Substance Use Topics   Alcohol use: Not Currently   Drug use: No    Comment: former heroin user",off for 2 years now".    Home Medications Prior to Admission medications   Medication Sig Start Date End Date Taking? Authorizing Provider  amLODipine (NORVASC) 10 MG tablet Take 1 tablet (10 mg total) by mouth daily. Patient not taking: Reported on 11/23/2021 07/22/19   Argentina Donovan, PA-C  cyclobenzaprine (FLEXERIL) 10 MG tablet Take 1 tablet (10 mg total) by mouth  2 (two) times daily as needed for muscle spasms. Patient not taking: Reported on 11/23/2021 09/03/19   Tedd Sias, PA  famotidine (PEPCID) 20 MG tablet Take 1 tablet (20 mg total) by mouth 2 (two) times daily. Patient not taking: Reported on 11/23/2021 07/09/19   Rodell Perna A, PA-C  gabapentin (NEURONTIN) 300 MG capsule Take 1 capsule (300 mg total) by mouth 3 (three) times daily. Patient not taking: Reported on 06/12/2019 06/28/17   Tereasa Coop, PA-C  ibuprofen (ADVIL) 800 MG tablet Take 1 tablet (800 mg total) by mouth 3 (three) times daily. Patient not taking: Reported on 11/23/2021 08/25/19   Margarita Mail, PA-C  meloxicam (MOBIC) 15 MG tablet Take 1 tablet (15 mg total) by mouth daily. Patient not taking: Reported on 07/22/2019 07/09/19   Rodell Perna A, PA-C  predniSONE (STERAPRED UNI-PAK 21 TAB) 10 MG (21) TBPK tablet Take by mouth daily. Take 6 tabs by mouth daily  for 2 days, then 5 tabs for 2 days, then 4 tabs for 2 days, then 3 tabs for 2 days, 2 tabs for 2 days, then 1 tab by mouth daily for 2 days  Patient not taking: Reported on 11/23/2021 09/03/19   Gailen Shelter, PA  tizanidine (ZANAFLEX) 2 MG capsule Take 1 capsule (2 mg total) by mouth 3 (three) times daily. Patient not taking: Reported on 11/23/2021 08/25/19   Arthor Captain, PA-C    Allergies    Bee venom and Amlodipine  Review of Systems   Review of Systems  Constitutional:  Negative for chills and fever.  HENT:  Negative for ear pain and sore throat.   Eyes:  Negative for pain and visual disturbance.  Respiratory:  Negative for cough and shortness of breath.   Cardiovascular:  Negative for chest pain and palpitations.  Gastrointestinal:  Negative for abdominal pain and vomiting.  Genitourinary:  Negative for dysuria and hematuria.  Musculoskeletal:  Negative for arthralgias and back pain.  Skin:  Negative for color change and rash.  Neurological:  Negative for seizures and syncope.  Psychiatric/Behavioral:   Negative for hallucinations and suicidal ideas.   All other systems reviewed and are negative.  Physical Exam Updated Vital Signs BP (!) 143/94    Pulse 76    Temp 99 F (37.2 C)    Resp 18    SpO2 94%   Physical Exam Vitals and nursing note reviewed.  Constitutional:      General: He is not in acute distress.    Appearance: He is well-developed.  HENT:     Head: Normocephalic and atraumatic.     Right Ear: External ear normal.     Left Ear: External ear normal.     Nose: Nose normal.     Mouth/Throat:     Mouth: Mucous membranes are moist.     Pharynx: Oropharynx is clear.  Eyes:     Conjunctiva/sclera: Conjunctivae normal.  Cardiovascular:     Rate and Rhythm: Normal rate and regular rhythm.     Heart sounds: Normal heart sounds. No murmur heard. Pulmonary:     Effort: Pulmonary effort is normal. No respiratory distress.     Breath sounds: Normal breath sounds.  Abdominal:     Palpations: Abdomen is soft.     Tenderness: There is no abdominal tenderness.  Musculoskeletal:        General: No swelling.     Cervical back: Neck supple.  Skin:    General: Skin is warm and dry.     Capillary Refill: Capillary refill takes less than 2 seconds.  Neurological:     General: No focal deficit present.     Mental Status: He is alert and oriented to person, place, and time.     GCS: GCS eye subscore is 4. GCS verbal subscore is 5. GCS motor subscore is 6.  Psychiatric:        Mood and Affect: Mood normal.    ED Results / Procedures / Treatments   Labs (all labs ordered are listed, but only abnormal results are displayed) Labs Reviewed - No data to display  EKG None  Procedures Procedures   Medications Ordered in ED Medications - No data to display  ED Course  I have reviewed the triage vital signs and the nursing notes.  Pertinent labs & imaging results that were available during my care of the patient were reviewed by me and considered in my medical decision making  (see chart for details).    MDM Rules/Calculators/A&P                          Patient has  no acute complaints at this time and would like to be discharged.  He denies SI/HI/AVH.  Physical exam is unremarkable.  Patient was discharged in stable condition.   Final Clinical Impression(s) / ED Diagnoses Final diagnoses:  Anxiety    Rx / DC Orders ED Discharge Orders     None        Jelan Batterton, Amalia Hailey, MD 11/24/21 1017    Drenda Freeze, MD 11/24/21 1452

## 2022-08-07 DIAGNOSIS — R110 Nausea: Secondary | ICD-10-CM | POA: Diagnosis not present

## 2022-08-07 DIAGNOSIS — J069 Acute upper respiratory infection, unspecified: Secondary | ICD-10-CM | POA: Diagnosis not present

## 2022-08-07 DIAGNOSIS — Z20822 Contact with and (suspected) exposure to covid-19: Secondary | ICD-10-CM | POA: Diagnosis not present

## 2023-01-07 DIAGNOSIS — Z1211 Encounter for screening for malignant neoplasm of colon: Secondary | ICD-10-CM | POA: Diagnosis not present

## 2023-01-07 DIAGNOSIS — Z23 Encounter for immunization: Secondary | ICD-10-CM | POA: Diagnosis not present

## 2023-01-07 DIAGNOSIS — Z0000 Encounter for general adult medical examination without abnormal findings: Secondary | ICD-10-CM | POA: Diagnosis not present

## 2023-01-07 DIAGNOSIS — N486 Induration penis plastica: Secondary | ICD-10-CM | POA: Diagnosis not present

## 2023-01-07 DIAGNOSIS — Z1330 Encounter for screening examination for mental health and behavioral disorders, unspecified: Secondary | ICD-10-CM | POA: Diagnosis not present

## 2023-01-07 DIAGNOSIS — B182 Chronic viral hepatitis C: Secondary | ICD-10-CM | POA: Diagnosis not present

## 2023-01-14 DIAGNOSIS — F1120 Opioid dependence, uncomplicated: Secondary | ICD-10-CM | POA: Diagnosis not present

## 2023-01-18 DIAGNOSIS — Z23 Encounter for immunization: Secondary | ICD-10-CM | POA: Diagnosis not present

## 2023-01-18 DIAGNOSIS — R7309 Other abnormal glucose: Secondary | ICD-10-CM | POA: Diagnosis not present

## 2023-02-11 DIAGNOSIS — F1120 Opioid dependence, uncomplicated: Secondary | ICD-10-CM | POA: Diagnosis not present

## 2023-03-11 DIAGNOSIS — F1120 Opioid dependence, uncomplicated: Secondary | ICD-10-CM | POA: Diagnosis not present

## 2023-04-08 DIAGNOSIS — F1120 Opioid dependence, uncomplicated: Secondary | ICD-10-CM | POA: Diagnosis not present

## 2023-05-10 DIAGNOSIS — F1120 Opioid dependence, uncomplicated: Secondary | ICD-10-CM | POA: Diagnosis not present

## 2023-05-13 DIAGNOSIS — F1120 Opioid dependence, uncomplicated: Secondary | ICD-10-CM | POA: Diagnosis not present

## 2023-05-16 DIAGNOSIS — Z6835 Body mass index (BMI) 35.0-35.9, adult: Secondary | ICD-10-CM | POA: Diagnosis not present

## 2023-05-16 DIAGNOSIS — M5432 Sciatica, left side: Secondary | ICD-10-CM | POA: Diagnosis not present

## 2023-06-03 DIAGNOSIS — F1120 Opioid dependence, uncomplicated: Secondary | ICD-10-CM | POA: Diagnosis not present

## 2023-06-10 DIAGNOSIS — F1120 Opioid dependence, uncomplicated: Secondary | ICD-10-CM | POA: Diagnosis not present

## 2023-07-01 DIAGNOSIS — F1120 Opioid dependence, uncomplicated: Secondary | ICD-10-CM | POA: Diagnosis not present

## 2023-07-08 DIAGNOSIS — F1120 Opioid dependence, uncomplicated: Secondary | ICD-10-CM | POA: Diagnosis not present

## 2023-07-29 DIAGNOSIS — F1120 Opioid dependence, uncomplicated: Secondary | ICD-10-CM | POA: Diagnosis not present

## 2023-07-30 DIAGNOSIS — R110 Nausea: Secondary | ICD-10-CM | POA: Diagnosis not present

## 2023-07-30 DIAGNOSIS — R1110 Vomiting, unspecified: Secondary | ICD-10-CM | POA: Diagnosis not present

## 2023-08-05 DIAGNOSIS — F1120 Opioid dependence, uncomplicated: Secondary | ICD-10-CM | POA: Diagnosis not present

## 2023-08-06 ENCOUNTER — Ambulatory Visit (HOSPITAL_COMMUNITY)
Admission: EM | Admit: 2023-08-06 | Discharge: 2023-08-06 | Disposition: A | Discharge status: Home / Self Care | Payer: BC Managed Care – PPO | Attending: Family Medicine | Admitting: Family Medicine

## 2023-08-06 ENCOUNTER — Encounter (HOSPITAL_COMMUNITY): Payer: Self-pay | Admitting: *Deleted

## 2023-08-06 DIAGNOSIS — R1030 Lower abdominal pain, unspecified: Secondary | ICD-10-CM | POA: Insufficient documentation

## 2023-08-06 DIAGNOSIS — R110 Nausea: Secondary | ICD-10-CM | POA: Diagnosis not present

## 2023-08-06 LAB — CBC WITH DIFFERENTIAL/PLATELET
Abs Immature Granulocytes: 0.03 10*3/uL (ref 0.00–0.07)
Basophils Absolute: 0 10*3/uL (ref 0.0–0.1)
Basophils Relative: 0 %
Eosinophils Absolute: 0.1 10*3/uL (ref 0.0–0.5)
Eosinophils Relative: 1 %
HCT: 47.8 % (ref 39.0–52.0)
Hemoglobin: 15.9 g/dL (ref 13.0–17.0)
Immature Granulocytes: 0 %
Lymphocytes Relative: 23 %
Lymphs Abs: 1.9 10*3/uL (ref 0.7–4.0)
MCH: 29.7 pg (ref 26.0–34.0)
MCHC: 33.3 g/dL (ref 30.0–36.0)
MCV: 89.2 fL (ref 80.0–100.0)
Monocytes Absolute: 0.6 10*3/uL (ref 0.1–1.0)
Monocytes Relative: 7 %
Neutro Abs: 5.5 10*3/uL (ref 1.7–7.7)
Neutrophils Relative %: 69 %
Platelets: 213 10*3/uL (ref 150–400)
RBC: 5.36 MIL/uL (ref 4.22–5.81)
RDW: 11.9 % (ref 11.5–15.5)
WBC: 8.1 10*3/uL (ref 4.0–10.5)
nRBC: 0 % (ref 0.0–0.2)

## 2023-08-06 LAB — COMPREHENSIVE METABOLIC PANEL
ALT: 24 U/L (ref 0–44)
AST: 24 U/L (ref 15–41)
Albumin: 4.1 g/dL (ref 3.5–5.0)
Alkaline Phosphatase: 117 U/L (ref 38–126)
Anion gap: 9 (ref 5–15)
BUN: 11 mg/dL (ref 6–20)
CO2: 27 mmol/L (ref 22–32)
Calcium: 9.7 mg/dL (ref 8.9–10.3)
Chloride: 102 mmol/L (ref 98–111)
Creatinine, Ser: 0.86 mg/dL (ref 0.61–1.24)
GFR, Estimated: >60 mL/min (ref >60)
Glucose, Bld: 106 mg/dL — ABNORMAL HIGH (ref 70–99)
Potassium: 4.5 mmol/L (ref 3.5–5.1)
Sodium: 138 mmol/L (ref 135–145)
Total Bilirubin: 0.5 mg/dL (ref 0.3–1.2)
Total Protein: 8 g/dL (ref 6.5–8.1)

## 2023-08-06 LAB — POCT URINALYSIS DIP (MANUAL ENTRY)
Bilirubin, UA: NEGATIVE
Glucose, UA: NEGATIVE mg/dL
Ketones, POC UA: NEGATIVE mg/dL
Leukocytes, UA: NEGATIVE
Nitrite, UA: NEGATIVE
Protein Ur, POC: NEGATIVE mg/dL
Spec Grav, UA: >=1.03 — AB (ref 1.010–1.025)
Urobilinogen, UA: 0.2 U/dL
pH, UA: 5.5 (ref 5.0–8.0)

## 2023-08-06 LAB — POCT FASTING CBG KUC MANUAL ENTRY: POCT Glucose (KUC): 121 mg/dL — AB (ref 70–99)

## 2023-08-06 LAB — LIPASE, BLOOD: Lipase: 24 U/L (ref 11–51)

## 2023-08-06 MED ORDER — ONDANSETRON 4 MG PO TBDP: 4.0000 mg | ORAL_TABLET | Freq: Three times a day (TID) | ORAL | 0 refills | Status: AC | PRN

## 2023-08-06 MED ORDER — SODIUM CHLORIDE 0.9 % IV BOLUS
1000.0000 mL | Freq: Once | INTRAVENOUS | Status: AC
  Administered 2023-08-06: 1000 mL via INTRAVENOUS

## 2023-08-06 NOTE — Discharge Instructions (Signed)
You have been seen today for abdominal pain. Your evaluation was not suggestive of any emergent condition requiring medical intervention at this time. However, some abdominal problems make take more time to appear. Therefore, it is very important for you to pay attention to any new symptoms or worsening of your current condition.  Please return here or to the Emergency Department immediately should you begin to feel worse in any way or have any of the following symptoms: increasing or different abdominal pain, persistent vomiting, inability to drink fluids, fevers, or shaking chills.   Please do your best to ensure adequate fluid intake in order to avoid dehydration. If you find that you are unable to tolerate drinking fluids regularly please proceed to the Emergency Department for evaluation.  

## 2023-08-06 NOTE — ED Triage Notes (Signed)
Pt states he has had lower abd pain that comes and goes, some diarrhea, and nausea x 1 week. He did go to [facility] last week they said GI bug take zofran as needed and come back if not better.

## 2023-08-06 NOTE — ED Provider Notes (Signed)
[facility]   [id number] 08/06/23 Arrival Time: 0848  ASSESSMENT & PLAN:  1. Lower abdominal pain   2. Nausea without vomiting    With mild lower abdominal tenderness; otherwise benign abdomen.  NO significant lab abnormalities to suggest cause. Suspect viral Gi illness. Without fever/emesis and with normal WBC, I am not suspicious for appendicitis. Discussed. Feeling better after IVF. He is comfortable with home observation.  Results for orders placed or performed during the hospital encounter of 08/06/23  Comprehensive metabolic panel  Result Value Ref Range   Sodium 138 135 - 145 mmol/L   Potassium 4.5 3.5 - 5.1 mmol/L   Chloride 102 98 - 111 mmol/L   CO2 27 22 - 32 mmol/L   Glucose, Bld 106 (H) 70 - 99 mg/dL   BUN 11 6 - 20 mg/dL   Creatinine, Ser 7.82 0.61 - 1.24 mg/dL   Calcium 9.7 8.9 - 95.6 mg/dL   Total Protein 8.0 6.5 - 8.1 g/dL   Albumin 4.1 3.5 - 5.0 g/dL   AST 24 15 - 41 U/L   ALT 24 0 - 44 U/L   Alkaline Phosphatase 117 38 - 126 U/L   Total Bilirubin 0.5 0.3 - 1.2 mg/dL   GFR, Estimated >21 >30 mL/min   Anion gap 9 5 - 15  Lipase, blood  Result Value Ref Range   Lipase 24 11 - 51 U/L  CBC with Differential/Platelet  Result Value Ref Range   WBC 8.1 4.0 - 10.5 K/uL   RBC 5.36 4.22 - 5.81 MIL/uL   Hemoglobin 15.9 13.0 - 17.0 g/dL   HCT 86.5 78.4 - 69.6 %   MCV 89.2 80.0 - 100.0 fL   MCH 29.7 26.0 - 34.0 pg   MCHC 33.3 30.0 - 36.0 g/dL   RDW 29.5 28.4 - 13.2 %   Platelets 213 150 - 400 K/uL   nRBC 0.0 0.0 - 0.2 %   Neutrophils Relative % 69 %   Neutro Abs 5.5 1.7 - 7.7 K/uL   Lymphocytes Relative 23 %   Lymphs Abs 1.9 0.7 - 4.0 K/uL   Monocytes Relative 7 %   Monocytes Absolute 0.6 0.1 - 1.0 K/uL   Eosinophils Relative 1 %   Eosinophils Absolute 0.1 0.0 - 0.5 K/uL   Basophils Relative 0 %   Basophils Absolute 0.0 0.0 - 0.1 K/uL   Immature Granulocytes 0 %   Abs Immature Granulocytes 0.03 0.00 - 0.07 K/uL  POC CBG monitoring  Result  Value Ref Range   POCT Glucose (KUC) 121 (A) 70 - 99 mg/dL  POC urinalysis dipstick  Result Value Ref Range   Color, UA yellow yellow   Clarity, UA cloudy (A) clear   Glucose, UA negative negative mg/dL   Bilirubin, UA negative negative   Ketones, POC UA negative negative mg/dL   Spec Grav, UA >=4.401 (A) 1.010 - 1.025   Blood, UA small (A) negative   pH, UA 5.5 5.0 - 8.0   Protein Ur, POC negative negative mg/dL   Urobilinogen, UA 0.2 0.2 or 1.0 E.U./dL   Nitrite, UA Negative Negative   Leukocytes, UA Negative Negative   Meds ordered this encounter  Medications   sodium chloride 0.9 % bolus 1,000 mL   ondansetron (ZOFRAN-ODT) 4 MG disintegrating tablet    Sig: Take 1 tablet (4 mg total) by mouth every 8 (eight) hours as needed for nausea or vomiting.    Dispense:  15 tablet    Refill:  0     Discharge Instructions      You have been seen today for abdominal pain. Your evaluation was not suggestive of any emergent condition requiring medical intervention at this time. However, some abdominal problems make take more time to appear. Therefore, it is very important for you to pay attention to any new symptoms or worsening of your current condition.  Please return here or to the Emergency Department immediately should you begin to feel worse in any way or have any of the following symptoms: increasing or different abdominal pain, persistent vomiting, inability to drink fluids, fevers, or shaking chills.   Please do your best to ensure adequate fluid intake in order to avoid dehydration. If you find that you are unable to tolerate drinking fluids regularly please proceed to the Emergency Department for evaluation.    Follow-up Information     Schedule an appointment as soon as possible for a visit  with Ofilia Neas, PA-C.   Specialty: Urgent Care Why: For follow up. Contact information: [address] [place] [address] [phone]                Agrees to ED  evaluation should any of his symptoms worsen.   Reviewed expectations re: course of current medical issues. Questions answered. Outlined signs and symptoms indicating need for more acute intervention. Patient verbalized understanding. After Visit Summary given.   SUBJECTIVE: History from: patient. Kenneth Brooks is a 47 y.o. male who presents with complaint of intermittent lower abdominal discomfort. Onset gradual,  over past week . Discomfort described as dull; without radiation; does not wake him at night. Reports normal flatus. Symptoms are unchanged since beginning. Fever: absent. Aggravating factors: have not been identified. Alleviating factors: have not been identified. Associated symptoms: fatigue. He denies arthralgias, belching, constipation, diarrhea, dysuria, fever, melena, and sweats. Appetite: present but decreased. PO intake: decreased. Last PO intake before bed yest evening. Ambulatory without assistance. Urinary symptoms: none. Normal BM without blood. No tx PTA.  Past Surgical History:  Procedure Laterality Date   Spinal Tap     WISDOM TOOTH EXTRACTION       OBJECTIVE:  Vitals:   08/06/23 1007  BP: (!) 147/88  Pulse: 68  Resp: 18  Temp: 98.3 F (36.8 C)  TempSrc: Oral  SpO2: 94%    General appearance: alert, oriented, no acute distress but appears fatigued HEENT: [place]; AT; oropharynx dry Lungs: unlabored respirations Abdomen: soft; without distention; mild  and poorly localized tenderness to palpation over lower abdomen ; normal bowel sounds; without masses or organomegaly; without guarding or rebound tenderness Back: without reported CVA tenderness; FROM at waist Extremities: without LE edema; symmetrical; without gross deformities Skin: warm and dry Neurologic: normal gait Psychological: alert and cooperative; normal mood and affect  Labs: Results for orders placed or performed during the hospital encounter of 08/06/23  Comprehensive metabolic panel   Result Value Ref Range   Sodium 138 135 - 145 mmol/L   Potassium 4.5 3.5 - 5.1 mmol/L   Chloride 102 98 - 111 mmol/L   CO2 27 22 - 32 mmol/L   Glucose, Bld 106 (H) 70 - 99 mg/dL   BUN 11 6 - 20 mg/dL   Creatinine, Ser 5.28 0.61 - 1.24 mg/dL   Calcium 9.7 8.9 - 41.3 mg/dL   Total Protein 8.0 6.5 - 8.1 g/dL   Albumin 4.1 3.5 - 5.0 g/dL   AST 24 15 - 41 U/L   ALT 24 0 -  44 U/L   Alkaline Phosphatase 117 38 - 126 U/L   Total Bilirubin 0.5 0.3 - 1.2 mg/dL   GFR, Estimated >82 >95 mL/min   Anion gap 9 5 - 15  Lipase, blood  Result Value Ref Range   Lipase 24 11 - 51 U/L  CBC with Differential/Platelet  Result Value Ref Range   WBC 8.1 4.0 - 10.5 K/uL   RBC 5.36 4.22 - 5.81 MIL/uL   Hemoglobin 15.9 13.0 - 17.0 g/dL   HCT 62.1 30.8 - 65.7 %   MCV 89.2 80.0 - 100.0 fL   MCH 29.7 26.0 - 34.0 pg   MCHC 33.3 30.0 - 36.0 g/dL   RDW 84.6 96.2 - 95.2 %   Platelets 213 150 - 400 K/uL   nRBC 0.0 0.0 - 0.2 %   Neutrophils Relative % 69 %   Neutro Abs 5.5 1.7 - 7.7 K/uL   Lymphocytes Relative 23 %   Lymphs Abs 1.9 0.7 - 4.0 K/uL   Monocytes Relative 7 %   Monocytes Absolute 0.6 0.1 - 1.0 K/uL   Eosinophils Relative 1 %   Eosinophils Absolute 0.1 0.0 - 0.5 K/uL   Basophils Relative 0 %   Basophils Absolute 0.0 0.0 - 0.1 K/uL   Immature Granulocytes 0 %   Abs Immature Granulocytes 0.03 0.00 - 0.07 K/uL  POC CBG monitoring  Result Value Ref Range   POCT Glucose (KUC) 121 (A) 70 - 99 mg/dL  POC urinalysis dipstick  Result Value Ref Range   Color, UA yellow yellow   Clarity, UA cloudy (A) clear   Glucose, UA negative negative mg/dL   Bilirubin, UA negative negative   Ketones, POC UA negative negative mg/dL   Spec Grav, UA >=8.413 (A) 1.010 - 1.025   Blood, UA small (A) negative   pH, UA 5.5 5.0 - 8.0   Protein Ur, POC negative negative mg/dL   Urobilinogen, UA 0.2 0.2 or 1.0 E.U./dL   Nitrite, UA Negative Negative   Leukocytes, UA Negative Negative   Labs Reviewed   COMPREHENSIVE METABOLIC PANEL - Abnormal; Notable for the following components:      Result Value   Glucose, Bld 106 (*)    All other components within normal limits  POCT FASTING CBG KUC MANUAL ENTRY - Abnormal; Notable for the following components:   POCT Glucose (KUC) 121 (*)    All other components within normal limits  POCT URINALYSIS DIP (MANUAL ENTRY) - Abnormal; Notable for the following components:   Clarity, UA cloudy (*)    Spec Grav, UA >=1.030 (*)    Blood, UA small (*)    All other components within normal limits  LIPASE, BLOOD  CBC WITH DIFFERENTIAL/PLATELET     Allergies  Allergen Reactions   Bee Venom Shortness Of Breath   Amlodipine Other (See Comments)                                               Past Medical History:  Diagnosis Date   Anemia    Anxiety    Cirrhosis (HCC)    Depression    Hepatitis C    Meningitis    spinal   Neuromuscular disorder (HCC)    siactic nerve probleme   Panic attack    Substance abuse (HCC)    former heroin user    Social  History   Socioeconomic History   Marital status: Divorced    Spouse name: Not on file   Number of children: 1   Years of education: Not on file   Highest education level: Not on file  Occupational History   Occupation: Producer, television/film/video  Tobacco Use   Smoking status: Every Day    Current packs/day: 0.50    Average packs/day: 0.5 packs/day for 35.7 years (17.8 ttl pk-yrs)    Types: Cigarettes    Start date: 12/04/1987   Smokeless tobacco: Never  Vaping Use   Vaping status: Every Day  Substance and Sexual Activity   Alcohol use: Not Currently   Drug use: No    Comment: former heroin user",off for 2 years now". now on methadone   Sexual activity: Not Currently  Other Topics Concern   Not on file  Social History Narrative   Not on file   Social Determinants of Health   Financial Resource Strain: Medium Risk (01/07/2023)   Received from [facility], [facility]   Overall Financial  Resource Strain (CARDIA)    Difficulty of Paying Living Expenses: Somewhat hard  Food Insecurity: Food Insecurity Present (01/07/2023)   Received from [facility], [facility]   Hunger Vital Sign    Worried About Running Out of Food in the Last Year: Sometimes true    Ran Out of Food in the Last Year: Sometimes true  Transportation Needs: No Transportation Needs (01/07/2023)   Received from [facility], [facility]   PRAPARE - Transportation    Lack of Transportation (Medical): No    Lack of Transportation (Non-Medical): No  Physical Activity: Insufficiently Active (01/07/2023)   Received from [facility], [facility]   Exercise Vital Sign    Days of Exercise per Week: 1 day    Minutes of Exercise per Session: 30 min  Stress: Stress Concern Present (01/07/2023)   Received from [facility], [facility] - Occupational Stress Questionnaire    Feeling of Stress : To some extent  Social Connections: Unknown (08/09/2022)   Received from [facility], [facility]   Social Network    Social Network: Not on file  Intimate Partner Violence: Not At Risk (01/07/2023)   Received from [facility], [facility]   Humiliation, Afraid, Rape, and Kick questionnaire    Fear of Current or Ex-Partner: No    Emotionally Abused: No    Physically Abused: No    Sexually Abused: No    Family History  Problem Relation Age of Onset   Cirrhosis Mother    Liver cancer Mother    Hypertension Father    Stroke Father 75   Heart attack Father    Crohn's disease Brother    Cancer Maternal Grandmother        smoker     Mardella Layman, MD 08/06/23 [phone]

## 2023-08-06 NOTE — ED Notes (Signed)
Ivf infusing. Patient sitting, texting on phone

## 2023-08-15 DIAGNOSIS — N401 Enlarged prostate with lower urinary tract symptoms: Secondary | ICD-10-CM | POA: Diagnosis not present

## 2023-08-15 DIAGNOSIS — R350 Frequency of micturition: Secondary | ICD-10-CM | POA: Diagnosis not present

## 2023-08-15 DIAGNOSIS — N486 Induration penis plastica: Secondary | ICD-10-CM | POA: Diagnosis not present

## 2023-08-15 DIAGNOSIS — N5201 Erectile dysfunction due to arterial insufficiency: Secondary | ICD-10-CM | POA: Diagnosis not present

## 2023-08-19 ENCOUNTER — Encounter: Payer: Self-pay | Admitting: Gastroenterology

## 2023-08-22 DIAGNOSIS — E291 Testicular hypofunction: Secondary | ICD-10-CM | POA: Diagnosis not present

## 2023-08-28 DIAGNOSIS — R350 Frequency of micturition: Secondary | ICD-10-CM | POA: Diagnosis not present

## 2023-08-28 DIAGNOSIS — E291 Testicular hypofunction: Secondary | ICD-10-CM | POA: Diagnosis not present

## 2023-08-28 DIAGNOSIS — N401 Enlarged prostate with lower urinary tract symptoms: Secondary | ICD-10-CM | POA: Diagnosis not present

## 2023-09-25 DIAGNOSIS — E291 Testicular hypofunction: Secondary | ICD-10-CM | POA: Diagnosis not present

## 2023-10-08 DIAGNOSIS — E291 Testicular hypofunction: Secondary | ICD-10-CM | POA: Diagnosis not present

## 2023-10-08 DIAGNOSIS — N5201 Erectile dysfunction due to arterial insufficiency: Secondary | ICD-10-CM | POA: Diagnosis not present

## 2023-11-04 NOTE — Progress Notes (Unsigned)
11/04/2023 Kenneth Brooks [id number] [birth year]   CHIEF COMPLAINT: Schedule an upper endoscopy and colonoscopy   HISTORY OF PRESENT ILLNESS: Kenneth Brooks is a 47 year old male with a past medical history of opioid use disorder on Methadone, chronic hepatitis C with cirrhosis, hep C was previously treated with Harvoni per infectious disease specialist Dr. Luciana Axe in 2018 with SVR. He presents to our office today as referred by Dr. Melissa Montane to schedule a screening colonoscopy.  He was initially seen in our office by Doug Sou, PA-C 08/27/2017 for further evaluation regarding cirrhosis seen on RUQ sono in setting or chronic hepatitis C undergoing treatment.  He also noted having intermittent rectal bleeding and CTAP showed an abnormal area in the sigmoid colon with circumferential thickening.  He subsequently underwent an EGD and colonoscopy 09/24/2017. The EGD showed evidence of portal hypertensive gastropathy without esophageal/gastric varices.  A repeat EGD in 2 years was recommended but was not done.  The colonoscopy resulted in a poor prep and a repeat colonoscopy was recommended but was not done.  He has not been seen in our office since then.  He presents today to schedule colonoscopy.  Currently, he denies having any dysphagia or heartburn.  No nausea or vomiting.  No upper or lower abdominal pain.  He passes a normal brown bowel movement daily.  He takes MiraLAX daily which controls his constipation.  No rectal bleeding or black stools.  He quit smoking cigarettes 2 years ago and he uses nicotine oral patches.  No alcohol use. On Methadone. He is unaware of prior diagnosis of cirrhosis.  Mother died from alcohol associated cirrhosis.  No known family history of esophageal, gastric or colon cancer.  He takes Aleve 3 tablets once or twice daily as needed, sometimes every other day for back and left shoulder pain.     Latest Ref Rng & Units 08/06/2023   10:45 AM 11/23/2021   11:25 AM  11/21/2021    7:12 AM  CBC  WBC 4.0 - 10.5 K/uL 8.1  8.7  9.2   Hemoglobin 13.0 - 17.0 g/dL 28.4  13.2  44.0   Hematocrit 39.0 - 52.0 % 47.8  48.7  46.6   Platelets 150 - 400 K/uL 213  258  227        Latest Ref Rng & Units 08/06/2023   10:45 AM 11/23/2021   11:25 AM 11/21/2021    7:12 AM  CMP  Glucose 70 - 99 mg/dL 102  725  366   BUN 6 - 20 mg/dL 11  14  18    Creatinine 0.61 - 1.24 mg/dL 4.40  3.47  4.25   Sodium 135 - 145 mmol/L 138  138  133   Potassium 3.5 - 5.1 mmol/L 4.5  3.8  3.4   Chloride 98 - 111 mmol/L 102  101  98   CO2 22 - 32 mmol/L 27  29  26    Calcium 8.9 - 10.3 mg/dL 9.7  9.5  9.2   Total Protein 6.5 - 8.1 g/dL 8.0     Total Bilirubin 0.3 - 1.2 mg/dL 0.5     Alkaline Phos 38 - 126 U/L 117     AST 15 - 41 U/L 24     ALT 0 - 44 U/L 24     Hep C RNA quant < 12 on 01/07/2023 AFP on 2.4 on 01/07/2023 Hep A total antibody nonreactive 07/31/2017  RUQ sono 11/23/2021: FINDINGS: Gallbladder: No gallstones  or wall thickening visualized. No sonographic Murphy sign noted by sonographer.   Common bile duct: Diameter: 3 mm, normal.   Liver: No focal lesion identified. Unchanged nodular contour and coarsened echotexture. Portal vein is patent on color Doppler imaging with normal direction of blood flow towards the liver.    IMPRESSION: 1. No acute abnormality. 2. Unchanged cirrhosis.  PAST GI PROCEDURES:  EGD 09/24/2017: - Normal esophagus.  - Portal hypertensive gastropathy.  - Normal examined duodenum.  - No specimens collected -Surveillance EGD 2 years   Colonoscopy 09/24/2017: - Preparation of the colon was poor.  - Stool in the entire examined colon.  - No specimens collected. -Repeat colonoscopy next available date due to poor prep   Past Medical History:  Diagnosis Date   Anemia    Anxiety    Cirrhosis (HCC)    Depression    Hepatitis C    Meningitis    spinal   Neuromuscular disorder (HCC)    siactic nerve probleme   Panic attack     Substance abuse (HCC)    former heroin user   Past Surgical History:  Procedure Laterality Date   Spinal Tap     WISDOM TOOTH EXTRACTION     Social History:  He is single. He has one son who lives in [place].  He previously smoked cigarettes daily x 35 years, quit smoking 2 years ago.  Uses nicotine oral patches daily. Past opioid disorder on Methadone.    Family History: Mother with history of alcohol associated cirrhosis, possibly had liver cancer.  Brother with history of Crohn's disease.  Father with heart disease/MI, CVA and hypertension.   Allergies  Allergen Reactions   Bee Venom Shortness Of Breath   Amlodipine Other (See Comments)      Outpatient Encounter Medications as of 11/05/2023  Medication Sig   methadone (DOLOPHINE) 10 MG tablet Pt states he takes 83mg  QD   ondansetron (ZOFRAN-ODT) 4 MG disintegrating tablet Take 1 tablet (4 mg total) by mouth every 8 (eight) hours as needed for nausea or vomiting.   No facility-administered encounter medications on file as of 11/05/2023.   REVIEW OF SYSTEMS:  Gen: Denies fever, sweats or chills. No weight loss.  CV: Denies chest pain, palpitations or edema. Resp: Denies cough, shortness of breath of hemoptysis.  GI: Denies heartburn, dysphagia, stomach or lower abdominal pain. No diarrhea or constipation.  GU: Denies urinary burning, blood in urine, increased urinary frequency or incontinence. MS: + Left shoulder and back pain. Derm: Denies rash, itchiness, skin lesions or unhealing ulcers. Psych: Denies depression, anxiety, memory loss or confusion. Heme: Denies bruising, easy bleeding. Neuro:  Denies headaches, dizziness or paresthesias. Endo:  Denies any problems with DM, thyroid or adrenal function.  PHYSICAL EXAM: BP 120/70   Pulse 75   Ht 6\' 1"  (1.854 m)   Wt 266 lb (120.7 kg)   BMI 35.09 kg/m   General: 47 year old male in no acute distress. Head: Normocephalic and atraumatic. Eyes:  Sclerae non-icteric,  conjunctive pink. Ears: Normal auditory acuity. Mouth: Dentition intact. No ulcers or lesions.  Neck: Supple, no lymphadenopathy or thyromegaly.  Lungs: Clear bilaterally to auscultation without wheezes, crackles or rhonchi. Heart: Regular rate and rhythm. No murmur, rub or gallop appreciated.  Abdomen: Soft, nontender, nondistended. No masses. No hepatosplenomegaly. Normoactive bowel sounds x 4 quadrants.  Rectal: Deferred.  Musculoskeletal: Symmetrical with no gross deformities. Skin: Warm and dry. No rash or lesions on visible extremities. Extremities: No edema. Neurological: Alert oriented  x 4, no focal deficits.  Psychological:  Alert and cooperative. Normal mood and affect.  ASSESSMENT AND PLAN:  47 year old male presents to discuss colon cancer screening.  Colonoscopy 09/2017 resulted in a poor prep, repeat colonoscopy recommended but was not done. -Colonoscopy benefits and risks discussed including risk with sedation, risk of bleeding, perforation and infection  -Patient declined colonoscopy, willing to proceed with Cologuard test.  He was informed if Cologuard test positive, a colonoscopy would be recommended. -Further recommendations to be determined after Cologuard test results reviewed  History of chronic hepatitis C GT1a with cirrhosis, hep C treated with Harvoni per ID with SVR.  EGD 09/2017 showed portal hypertensive gastropathy without esophageal or gastric varices.  Patient lost to follow-up.  He denies having any knowledge regarding his prior diagnosis of cirrhosis.  Normal LFTs 08/2023.  Hep C RNA quant < 12 on 01/07/2022.  RUQ sonogram 11/2021 showed cirrhosis with a patent portal vein. -EGD to survey for varices benefits and risks discussed including risk with sedation, risk of bleeding, perforation and infection  -CBC, CMP, iron, ferritin, IgG, PT/INR, AFP, alpha-1 antitrypsin, AMA, SMA, ANA, ceruloplasmin, hepatitis A total antibody, hepatitis B surface antigen, hepatitis B  core total antibody -RUQ sonogram  Further recommendations to be determined after the above evaluation completed          CC:  Ofilia Neas, PA-C

## 2023-11-05 ENCOUNTER — Encounter: Payer: Self-pay | Admitting: Nurse Practitioner

## 2023-11-05 ENCOUNTER — Other Ambulatory Visit (INDEPENDENT_AMBULATORY_CARE_PROVIDER_SITE_OTHER): Payer: BC Managed Care – PPO

## 2023-11-05 ENCOUNTER — Ambulatory Visit: Payer: BC Managed Care – PPO | Admitting: Nurse Practitioner

## 2023-11-05 VITALS — BP 120/70 | HR 75 | Ht 73.0 in | Wt 266.0 lb

## 2023-11-05 DIAGNOSIS — Z8719 Personal history of other diseases of the digestive system: Secondary | ICD-10-CM | POA: Diagnosis not present

## 2023-11-05 DIAGNOSIS — Z1211 Encounter for screening for malignant neoplasm of colon: Secondary | ICD-10-CM | POA: Diagnosis not present

## 2023-11-05 DIAGNOSIS — Z8619 Personal history of other infectious and parasitic diseases: Secondary | ICD-10-CM | POA: Diagnosis not present

## 2023-11-05 DIAGNOSIS — K7460 Unspecified cirrhosis of liver: Secondary | ICD-10-CM | POA: Diagnosis not present

## 2023-11-05 LAB — CBC
HCT: 56.2 % — ABNORMAL HIGH (ref 39.0–52.0)
Hemoglobin: 18.5 g/dL (ref 13.0–17.0)
MCHC: 33 g/dL (ref 30.0–36.0)
MCV: 92 fL (ref 78.0–100.0)
Platelets: 212 10*3/uL (ref 150.0–400.0)
RBC: 6.11 Mil/uL — ABNORMAL HIGH (ref 4.22–5.81)
RDW: 13.4 % (ref 11.5–15.5)
WBC: 6.7 10*3/uL (ref 4.0–10.5)

## 2023-11-05 LAB — COMPREHENSIVE METABOLIC PANEL
ALT: 50 U/L (ref 0–53)
AST: 29 U/L (ref 0–37)
Albumin: 4.3 g/dL (ref 3.5–5.2)
Alkaline Phosphatase: 105 U/L (ref 39–117)
BUN: 12 mg/dL (ref 6–23)
CO2: 26 meq/L (ref 19–32)
Calcium: 9.1 mg/dL (ref 8.4–10.5)
Chloride: 100 meq/L (ref 96–112)
Creatinine, Ser: 0.86 mg/dL (ref 0.40–1.50)
GFR: 103.98 mL/min (ref >60.00)
Glucose, Bld: 105 mg/dL — ABNORMAL HIGH (ref 70–99)
Potassium: 4.4 meq/L (ref 3.5–5.1)
Sodium: 134 meq/L — ABNORMAL LOW (ref 135–145)
Total Bilirubin: 0.6 mg/dL (ref 0.2–1.2)
Total Protein: 7.6 g/dL (ref 6.0–8.3)

## 2023-11-05 LAB — IRON: Iron: 178 ug/dL — ABNORMAL HIGH (ref 42–165)

## 2023-11-05 LAB — FERRITIN: Ferritin: 36.7 ng/mL (ref 22.0–322.0)

## 2023-11-05 LAB — PROTIME-INR
INR: 1.2 {ratio} — ABNORMAL HIGH (ref 0.8–1.0)
Prothrombin Time: 12.6 s (ref 9.6–13.1)

## 2023-11-05 NOTE — Patient Instructions (Signed)
You have been scheduled for an endoscopy. Please follow written instructions given to you at your visit today.  If you use inhalers (even only as needed), please bring them with you on the day of your procedure. ___________________________________________________________________________ Your provider has requested that you go to the basement level for lab work before leaving today. Press "B" on the elevator. The lab is located at the first door on the left as you exit the elevator.  You have been scheduled for an abdominal ultrasound at [facility] (1st floor of hospital) on 11/08/23 at 7:00 am. Please arrive 15 minutes prior to your appointment for registration. Make certain not to have anything to eat or drink 6 hours prior to your appointment. Should you need to reschedule your appointment, please contact radiology at [phone]. This test typically takes about 30 minutes to perform.  Your provider has ordered Cologuard testing as an option for colon cancer screening. This is performed by [employer] and may be out of network with your insurance. PRIOR to completing the test, it is YOUR responsibility to contact your insurance about covered benefits for this test. Your out of pocket expense could be anywhere from $0.00 to $649.00.   When you call to check coverage with your insurer, please provide the following information:   -The ONLY provider of Cologuard is Opt[facility], dispensing  - CPT code for Cologuard is [phone].  [employer] NPI # [id number]  -[employer] Tax ID # [id number]   We have already sent your demographic and insurance information to [employer] (phone number [phone]) and they should contact you within the next week regarding your test. If you have not heard from them within the next week, please call our office at [phone].  Due to recent changes in healthcare laws, you may see the results of your imaging and  laboratory studies on MyChart before your provider has had a chance to review them.  We understand that in some cases there may be results that are confusing or concerning to you. Not all laboratory results come back in the same time frame and the provider may be waiting for multiple results in order to interpret others.  Please give [place] 48 hours in order for your provider to thoroughly review all the results before contacting the office for clarification of your results.   Thank you for trusting me with your gastrointestinal care!   Alcide Evener, CRNP

## 2023-11-07 ENCOUNTER — Other Ambulatory Visit: Payer: Self-pay

## 2023-11-07 DIAGNOSIS — Z1211 Encounter for screening for malignant neoplasm of colon: Secondary | ICD-10-CM | POA: Insufficient documentation

## 2023-11-07 DIAGNOSIS — E8801 Alpha-1-antitrypsin deficiency: Secondary | ICD-10-CM

## 2023-11-07 DIAGNOSIS — D582 Other hemoglobinopathies: Secondary | ICD-10-CM

## 2023-11-08 ENCOUNTER — Other Ambulatory Visit (INDEPENDENT_AMBULATORY_CARE_PROVIDER_SITE_OTHER): Payer: BC Managed Care – PPO

## 2023-11-08 ENCOUNTER — Other Ambulatory Visit: Payer: Self-pay

## 2023-11-08 ENCOUNTER — Ambulatory Visit (HOSPITAL_COMMUNITY): Payer: BC Managed Care – PPO

## 2023-11-08 DIAGNOSIS — D582 Other hemoglobinopathies: Secondary | ICD-10-CM | POA: Diagnosis not present

## 2023-11-08 LAB — CBC
HCT: 54.8 % — ABNORMAL HIGH (ref 39.0–52.0)
Hemoglobin: 17.7 g/dL — ABNORMAL HIGH (ref 13.0–17.0)
MCHC: 32.4 g/dL (ref 30.0–36.0)
MCV: 93 fL (ref 78.0–100.0)
Platelets: 202 10*3/uL (ref 150.0–400.0)
RBC: 5.89 Mil/uL — ABNORMAL HIGH (ref 4.22–5.81)
RDW: 13.5 % (ref 11.5–15.5)
WBC: 6.8 10*3/uL (ref 4.0–10.5)

## 2023-11-08 LAB — IBC + FERRITIN
Ferritin: 38.7 ng/mL (ref 22.0–322.0)
Iron: 97 ug/dL (ref 42–165)
Saturation Ratios: 26.5 % (ref 20.0–50.0)
TIBC: 365.4 ug/dL (ref 250.0–450.0)
Transferrin: 261 mg/dL (ref 212.0–360.0)

## 2023-11-09 DIAGNOSIS — F1120 Opioid dependence, uncomplicated: Secondary | ICD-10-CM | POA: Diagnosis not present

## 2023-11-10 DIAGNOSIS — F1120 Opioid dependence, uncomplicated: Secondary | ICD-10-CM | POA: Diagnosis not present

## 2023-11-10 LAB — HEPATITIS B SURFACE ANTIBODY,QUALITATIVE: Hep B S Ab: NONREACTIVE

## 2023-11-10 LAB — CERULOPLASMIN: Ceruloplasmin: 23 mg/dL (ref 14–30)

## 2023-11-10 LAB — ANA: Anti Nuclear Antibody (ANA): NEGATIVE

## 2023-11-10 LAB — ALPHA-1-ANTITRYPSIN: A-1 Antitrypsin, Ser: 62 mg/dL — ABNORMAL LOW (ref 83–199)

## 2023-11-10 LAB — AFP TUMOR MARKER: AFP-Tumor Marker: 3.2 ng/mL (ref <6.1)

## 2023-11-10 LAB — HEPATITIS A ANTIBODY, TOTAL: Hepatitis A AB,Total: REACTIVE — AB

## 2023-11-10 LAB — HEPATITIS B CORE ANTIBODY, TOTAL: Hep B Core Total Ab: NONREACTIVE

## 2023-11-10 LAB — IGG: IgG (Immunoglobin G), Serum: 1467 mg/dL (ref 600–1640)

## 2023-11-10 LAB — MITOCHONDRIAL ANTIBODIES: Mitochondrial M2 Ab, IgG: <=20 U (ref <=20.0)

## 2023-11-10 LAB — ANTI-SMOOTH MUSCLE ANTIBODY, IGG: Actin (Smooth Muscle) Antibody (IGG): <20 U (ref <20)

## 2023-11-11 DIAGNOSIS — F1120 Opioid dependence, uncomplicated: Secondary | ICD-10-CM | POA: Diagnosis not present

## 2023-11-12 DIAGNOSIS — Z1211 Encounter for screening for malignant neoplasm of colon: Secondary | ICD-10-CM | POA: Diagnosis not present

## 2023-11-12 DIAGNOSIS — F1120 Opioid dependence, uncomplicated: Secondary | ICD-10-CM | POA: Diagnosis not present

## 2023-11-13 DIAGNOSIS — F1120 Opioid dependence, uncomplicated: Secondary | ICD-10-CM | POA: Diagnosis not present

## 2023-11-14 DIAGNOSIS — F1120 Opioid dependence, uncomplicated: Secondary | ICD-10-CM | POA: Diagnosis not present

## 2023-11-15 DIAGNOSIS — F1120 Opioid dependence, uncomplicated: Secondary | ICD-10-CM | POA: Diagnosis not present

## 2023-11-16 DIAGNOSIS — F1120 Opioid dependence, uncomplicated: Secondary | ICD-10-CM | POA: Diagnosis not present

## 2023-11-17 DIAGNOSIS — F1120 Opioid dependence, uncomplicated: Secondary | ICD-10-CM | POA: Diagnosis not present

## 2023-11-18 DIAGNOSIS — F1120 Opioid dependence, uncomplicated: Secondary | ICD-10-CM | POA: Diagnosis not present

## 2023-11-19 DIAGNOSIS — F1120 Opioid dependence, uncomplicated: Secondary | ICD-10-CM | POA: Diagnosis not present

## 2023-11-19 LAB — HEMOCHROMATOSIS DNA-PCR(C282Y,H63D)

## 2023-11-20 DIAGNOSIS — F1120 Opioid dependence, uncomplicated: Secondary | ICD-10-CM | POA: Diagnosis not present

## 2023-11-21 DIAGNOSIS — F1120 Opioid dependence, uncomplicated: Secondary | ICD-10-CM | POA: Diagnosis not present

## 2023-11-22 DIAGNOSIS — F1120 Opioid dependence, uncomplicated: Secondary | ICD-10-CM | POA: Diagnosis not present

## 2023-11-23 DIAGNOSIS — F1120 Opioid dependence, uncomplicated: Secondary | ICD-10-CM | POA: Diagnosis not present

## 2023-11-24 DIAGNOSIS — F1120 Opioid dependence, uncomplicated: Secondary | ICD-10-CM | POA: Diagnosis not present

## 2023-11-24 LAB — COLOGUARD: COLOGUARD: NEGATIVE

## 2023-11-25 DIAGNOSIS — F1120 Opioid dependence, uncomplicated: Secondary | ICD-10-CM | POA: Diagnosis not present

## 2023-11-26 DIAGNOSIS — F1120 Opioid dependence, uncomplicated: Secondary | ICD-10-CM | POA: Diagnosis not present

## 2023-11-27 DIAGNOSIS — F1120 Opioid dependence, uncomplicated: Secondary | ICD-10-CM | POA: Diagnosis not present

## 2023-11-28 DIAGNOSIS — F1120 Opioid dependence, uncomplicated: Secondary | ICD-10-CM | POA: Diagnosis not present

## 2023-11-29 ENCOUNTER — Telehealth: Payer: Self-pay | Admitting: Gastroenterology

## 2023-11-29 DIAGNOSIS — F1120 Opioid dependence, uncomplicated: Secondary | ICD-10-CM | POA: Diagnosis not present

## 2023-11-29 NOTE — Telephone Encounter (Signed)
Inbound call from patient stating that he needed to cancel his appointment with Dr. Lavon Paganini on Monday for an EGD at 4:00 due to not having transportation for procedure. Patient states he will call back to reschedule at a later time.

## 2023-11-30 DIAGNOSIS — F1120 Opioid dependence, uncomplicated: Secondary | ICD-10-CM | POA: Diagnosis not present

## 2023-12-01 DIAGNOSIS — F1120 Opioid dependence, uncomplicated: Secondary | ICD-10-CM | POA: Diagnosis not present

## 2023-12-02 ENCOUNTER — Encounter: Payer: BC Managed Care – PPO | Admitting: Gastroenterology

## 2023-12-02 DIAGNOSIS — F1120 Opioid dependence, uncomplicated: Secondary | ICD-10-CM | POA: Diagnosis not present

## 2023-12-02 NOTE — Telephone Encounter (Signed)
ok 

## 2023-12-03 DIAGNOSIS — F1120 Opioid dependence, uncomplicated: Secondary | ICD-10-CM | POA: Diagnosis not present

## 2023-12-04 DIAGNOSIS — F1120 Opioid dependence, uncomplicated: Secondary | ICD-10-CM | POA: Diagnosis not present

## 2023-12-05 DIAGNOSIS — F1120 Opioid dependence, uncomplicated: Secondary | ICD-10-CM | POA: Diagnosis not present

## 2023-12-06 DIAGNOSIS — F1120 Opioid dependence, uncomplicated: Secondary | ICD-10-CM | POA: Diagnosis not present

## 2023-12-07 DIAGNOSIS — F1120 Opioid dependence, uncomplicated: Secondary | ICD-10-CM | POA: Diagnosis not present

## 2023-12-08 DIAGNOSIS — F1120 Opioid dependence, uncomplicated: Secondary | ICD-10-CM | POA: Diagnosis not present

## 2023-12-09 DIAGNOSIS — F1120 Opioid dependence, uncomplicated: Secondary | ICD-10-CM | POA: Diagnosis not present

## 2023-12-10 DIAGNOSIS — F1120 Opioid dependence, uncomplicated: Secondary | ICD-10-CM | POA: Diagnosis not present

## 2023-12-11 DIAGNOSIS — F1120 Opioid dependence, uncomplicated: Secondary | ICD-10-CM | POA: Diagnosis not present

## 2023-12-12 DIAGNOSIS — F1120 Opioid dependence, uncomplicated: Secondary | ICD-10-CM | POA: Diagnosis not present

## 2023-12-13 DIAGNOSIS — F1120 Opioid dependence, uncomplicated: Secondary | ICD-10-CM | POA: Diagnosis not present

## 2023-12-14 DIAGNOSIS — F1120 Opioid dependence, uncomplicated: Secondary | ICD-10-CM | POA: Diagnosis not present

## 2023-12-15 DIAGNOSIS — F1120 Opioid dependence, uncomplicated: Secondary | ICD-10-CM | POA: Diagnosis not present

## 2023-12-16 DIAGNOSIS — F1120 Opioid dependence, uncomplicated: Secondary | ICD-10-CM | POA: Diagnosis not present

## 2023-12-17 DIAGNOSIS — F1120 Opioid dependence, uncomplicated: Secondary | ICD-10-CM | POA: Diagnosis not present

## 2023-12-18 DIAGNOSIS — F1120 Opioid dependence, uncomplicated: Secondary | ICD-10-CM | POA: Diagnosis not present

## 2023-12-19 DIAGNOSIS — F1120 Opioid dependence, uncomplicated: Secondary | ICD-10-CM | POA: Diagnosis not present

## 2023-12-20 DIAGNOSIS — F1120 Opioid dependence, uncomplicated: Secondary | ICD-10-CM | POA: Diagnosis not present

## 2023-12-21 DIAGNOSIS — F1120 Opioid dependence, uncomplicated: Secondary | ICD-10-CM | POA: Diagnosis not present

## 2023-12-22 DIAGNOSIS — F1120 Opioid dependence, uncomplicated: Secondary | ICD-10-CM | POA: Diagnosis not present

## 2023-12-23 DIAGNOSIS — F1120 Opioid dependence, uncomplicated: Secondary | ICD-10-CM | POA: Diagnosis not present

## 2023-12-24 DIAGNOSIS — F1120 Opioid dependence, uncomplicated: Secondary | ICD-10-CM | POA: Diagnosis not present

## 2023-12-25 DIAGNOSIS — F1120 Opioid dependence, uncomplicated: Secondary | ICD-10-CM | POA: Diagnosis not present

## 2023-12-26 DIAGNOSIS — F1120 Opioid dependence, uncomplicated: Secondary | ICD-10-CM | POA: Diagnosis not present

## 2023-12-27 DIAGNOSIS — F1120 Opioid dependence, uncomplicated: Secondary | ICD-10-CM | POA: Diagnosis not present

## 2023-12-28 DIAGNOSIS — F1120 Opioid dependence, uncomplicated: Secondary | ICD-10-CM | POA: Diagnosis not present

## 2023-12-29 DIAGNOSIS — F1120 Opioid dependence, uncomplicated: Secondary | ICD-10-CM | POA: Diagnosis not present

## 2023-12-30 DIAGNOSIS — F1120 Opioid dependence, uncomplicated: Secondary | ICD-10-CM | POA: Diagnosis not present

## 2023-12-31 DIAGNOSIS — F1120 Opioid dependence, uncomplicated: Secondary | ICD-10-CM | POA: Diagnosis not present

## 2024-01-01 DIAGNOSIS — F1120 Opioid dependence, uncomplicated: Secondary | ICD-10-CM | POA: Diagnosis not present

## 2024-01-02 DIAGNOSIS — F1120 Opioid dependence, uncomplicated: Secondary | ICD-10-CM | POA: Diagnosis not present

## 2024-01-03 DIAGNOSIS — F1120 Opioid dependence, uncomplicated: Secondary | ICD-10-CM | POA: Diagnosis not present

## 2024-01-04 DIAGNOSIS — F1120 Opioid dependence, uncomplicated: Secondary | ICD-10-CM | POA: Diagnosis not present

## 2024-01-05 DIAGNOSIS — F1120 Opioid dependence, uncomplicated: Secondary | ICD-10-CM | POA: Diagnosis not present

## 2024-01-06 DIAGNOSIS — F1120 Opioid dependence, uncomplicated: Secondary | ICD-10-CM | POA: Diagnosis not present

## 2024-01-08 DIAGNOSIS — F1120 Opioid dependence, uncomplicated: Secondary | ICD-10-CM | POA: Diagnosis not present

## 2024-01-09 DIAGNOSIS — R0789 Other chest pain: Secondary | ICD-10-CM | POA: Diagnosis not present

## 2024-01-09 DIAGNOSIS — Z1331 Encounter for screening for depression: Secondary | ICD-10-CM | POA: Diagnosis not present

## 2024-01-09 DIAGNOSIS — F1120 Opioid dependence, uncomplicated: Secondary | ICD-10-CM | POA: Diagnosis not present

## 2024-01-09 DIAGNOSIS — R7309 Other abnormal glucose: Secondary | ICD-10-CM | POA: Diagnosis not present

## 2024-01-09 DIAGNOSIS — R8290 Unspecified abnormal findings in urine: Secondary | ICD-10-CM | POA: Diagnosis not present

## 2024-01-09 DIAGNOSIS — Z1211 Encounter for screening for malignant neoplasm of colon: Secondary | ICD-10-CM | POA: Diagnosis not present

## 2024-01-09 DIAGNOSIS — R0781 Pleurodynia: Secondary | ICD-10-CM | POA: Diagnosis not present

## 2024-01-09 DIAGNOSIS — Z0000 Encounter for general adult medical examination without abnormal findings: Secondary | ICD-10-CM | POA: Diagnosis not present

## 2024-01-09 DIAGNOSIS — K7460 Unspecified cirrhosis of liver: Secondary | ICD-10-CM | POA: Diagnosis not present

## 2024-01-10 DIAGNOSIS — F1120 Opioid dependence, uncomplicated: Secondary | ICD-10-CM | POA: Diagnosis not present

## 2024-01-11 DIAGNOSIS — F1120 Opioid dependence, uncomplicated: Secondary | ICD-10-CM | POA: Diagnosis not present

## 2024-01-12 DIAGNOSIS — F1120 Opioid dependence, uncomplicated: Secondary | ICD-10-CM | POA: Diagnosis not present

## 2024-01-13 DIAGNOSIS — F1120 Opioid dependence, uncomplicated: Secondary | ICD-10-CM | POA: Diagnosis not present

## 2024-01-14 DIAGNOSIS — F1120 Opioid dependence, uncomplicated: Secondary | ICD-10-CM | POA: Diagnosis not present

## 2024-01-15 DIAGNOSIS — F1120 Opioid dependence, uncomplicated: Secondary | ICD-10-CM | POA: Diagnosis not present

## 2024-01-16 DIAGNOSIS — F1120 Opioid dependence, uncomplicated: Secondary | ICD-10-CM | POA: Diagnosis not present

## 2024-01-17 DIAGNOSIS — F1120 Opioid dependence, uncomplicated: Secondary | ICD-10-CM | POA: Diagnosis not present

## 2024-01-18 DIAGNOSIS — F1120 Opioid dependence, uncomplicated: Secondary | ICD-10-CM | POA: Diagnosis not present

## 2024-01-19 DIAGNOSIS — F1120 Opioid dependence, uncomplicated: Secondary | ICD-10-CM | POA: Diagnosis not present

## 2024-01-20 DIAGNOSIS — F1120 Opioid dependence, uncomplicated: Secondary | ICD-10-CM | POA: Diagnosis not present

## 2024-01-21 DIAGNOSIS — F1120 Opioid dependence, uncomplicated: Secondary | ICD-10-CM | POA: Diagnosis not present

## 2024-01-22 DIAGNOSIS — F1120 Opioid dependence, uncomplicated: Secondary | ICD-10-CM | POA: Diagnosis not present

## 2024-01-23 DIAGNOSIS — F1120 Opioid dependence, uncomplicated: Secondary | ICD-10-CM | POA: Diagnosis not present

## 2024-01-24 DIAGNOSIS — F1120 Opioid dependence, uncomplicated: Secondary | ICD-10-CM | POA: Diagnosis not present

## 2024-01-25 DIAGNOSIS — F1120 Opioid dependence, uncomplicated: Secondary | ICD-10-CM | POA: Diagnosis not present

## 2024-01-26 DIAGNOSIS — F1120 Opioid dependence, uncomplicated: Secondary | ICD-10-CM | POA: Diagnosis not present

## 2024-01-27 DIAGNOSIS — F1120 Opioid dependence, uncomplicated: Secondary | ICD-10-CM | POA: Diagnosis not present

## 2024-01-28 DIAGNOSIS — F1120 Opioid dependence, uncomplicated: Secondary | ICD-10-CM | POA: Diagnosis not present

## 2024-01-28 DIAGNOSIS — J069 Acute upper respiratory infection, unspecified: Secondary | ICD-10-CM | POA: Diagnosis not present

## 2024-01-29 DIAGNOSIS — F1120 Opioid dependence, uncomplicated: Secondary | ICD-10-CM | POA: Diagnosis not present

## 2024-02-24 ENCOUNTER — Encounter (HOSPITAL_COMMUNITY): Payer: Self-pay

## 2024-02-24 ENCOUNTER — Ambulatory Visit (HOSPITAL_COMMUNITY)
Admission: EM | Admit: 2024-02-24 | Discharge: 2024-02-24 | Disposition: A | Discharge status: Home / Self Care | Attending: Family Medicine | Admitting: Family Medicine

## 2024-02-24 ENCOUNTER — Ambulatory Visit (INDEPENDENT_AMBULATORY_CARE_PROVIDER_SITE_OTHER)

## 2024-02-24 DIAGNOSIS — M549 Dorsalgia, unspecified: Secondary | ICD-10-CM

## 2024-02-24 DIAGNOSIS — R051 Acute cough: Secondary | ICD-10-CM | POA: Diagnosis not present

## 2024-02-24 DIAGNOSIS — M25511 Pain in right shoulder: Secondary | ICD-10-CM

## 2024-02-24 DIAGNOSIS — R0789 Other chest pain: Secondary | ICD-10-CM | POA: Diagnosis not present

## 2024-02-24 MED ORDER — TIZANIDINE HCL 4 MG PO TABS: 4.0000 mg | ORAL_TABLET | Freq: Three times a day (TID) | ORAL | 0 refills | Status: AC | PRN

## 2024-02-24 NOTE — Discharge Instructions (Signed)
 You were seen today for continued chest, shoulder and back pain.  Your xrays appears normal today, but if radiology reads them differently we will notify you.  I have sent out several other medications for you to take.  Please stop the cyclobenzaprine given previously.  I have sent out a different muscle relaxer that  may make you sleepy, but you will be more likely to use during the day for pain.  I recommend you pick up the diclofenac gel as prescribed previously.  Use a heating pad to the area as well.  If you continue with pain you should follow up with your primary care provider for possible referral to orthopedics or physical therapy.

## 2024-02-24 NOTE — ED Triage Notes (Addendum)
 Pt c/o rt upper chest/shoulder/back pain x2wks. States is currently on amoxicillin for PNA. Denies known injury. Pain worse on movement. States thinks he took ibuprofen this morning but can't remember.

## 2024-02-24 NOTE — ED Provider Notes (Addendum)
 [facility]    CSN: [id number] Arrival date & time: 02/24/24  1259      History   Chief Complaint Chief Complaint  Patient presents with   Chest Pain   Back Pain    HPI Kenneth Brooks is a 48 y.o. male.    Chest Pain Associated symptoms: back pain and cough   Back Pain Associated symptoms: chest pain    Patient is here for right upper shoulder, chest, and back pack for several weeks.  No known injury recently.  He did fall and slip on ice about 2 months ago, with rib pain and that resolved.  He did see another urgent care last week, placed on amoxil for possible pneumonia, as well flexeril and topical diclofenac.  Pain is worse with movement.  Most pain is in the back of the shoulder with movement.        Past Medical History:  Diagnosis Date   Anemia    Anxiety    Cirrhosis (HCC)    Depression    Hepatitis C    Low testosterone    Meningitis    spinal   Neuromuscular disorder (HCC)    siactic nerve probleme   Panic attack    Substance abuse (HCC)    former heroin user    Patient Active Problem List   Diagnosis Date Noted   Colon cancer screening 11/07/2023   Substance abuse (HCC) 01/28/2018   Cirrhosis of liver (HCC) 07/31/2017   Chronic hepatitis C without hepatic coma (HCC) 07/01/2017    Past Surgical History:  Procedure Laterality Date   COLONOSCOPY     ESOPHAGOGASTRODUODENOSCOPY     Spinal Tap     WISDOM TOOTH EXTRACTION         Home Medications    Prior to Admission medications   Medication Sig Start Date End Date Taking? Authorizing Provider  EPINEPHrine 0.3 mg/0.3 mL IJ SOAJ injection Inject 0.3 mg into the muscle as needed.    [provider]  methadone (DOLOPHINE) 10 MG tablet Pt states he takes 83mg  QD    [provider]  ondansetron (ZOFRAN-ODT) 4 MG disintegrating tablet Take 1 tablet (4 mg total) by mouth every 8 (eight) hours as needed for nausea or vomiting. 08/06/23   Mardella Layman, MD   sildenafil (VIAGRA) 25 MG tablet Take 25 mg by mouth daily as needed (Patient asked that this be added to his medication list. He could not recall dosing, but stated he taking it PRN.).    [provider]  testosterone cypionate (DEPOTESTOSTERONE CYPIONATE) 200 MG/ML injection Inject 200 mg into the muscle every 14 (fourteen) days. 08/30/23   [provider]    Family History Family History  Problem Relation Age of Onset   Cirrhosis Mother    Liver cancer Mother    Hypertension Father    Stroke Father 69   Heart attack Father    Crohn's disease Brother    Lung cancer Maternal Grandmother        smoker    Social History Social History   Tobacco Use   Smoking status: Former    Current packs/day: 0.50    Average packs/day: 0.5 packs/day for 36.2 years (18.1 ttl pk-yrs)    Types: Cigarettes    Start date: 12/04/1987   Smokeless tobacco: Never  Vaping Use   Vaping status: Former  Substance Use Topics   Alcohol use: Not Currently   Drug use: Not Currently    Comment: former  heroin user",off for 2 years now". now on methadone     Allergies   Bee venom and Amlodipine   Review of Systems Review of Systems  Constitutional: Negative.   HENT: Negative.    Respiratory:  Positive for cough.   Cardiovascular:  Positive for chest pain.  Genitourinary: Negative.   Musculoskeletal:  Positive for back pain.  Psychiatric/Behavioral: Negative.       Physical Exam Triage Vital Signs ED Triage Vitals  Encounter Vitals Group     BP 02/24/24 1339 135/84     Systolic BP Percentile --      Diastolic BP Percentile --      Pulse Rate 02/24/24 1339 96     Resp 02/24/24 1339 18     Temp 02/24/24 1339 98.3 F (36.8 C)     Temp Source 02/24/24 1339 Oral     SpO2 02/24/24 1339 96 %     Weight --      Height --      Head Circumference --      Peak Flow --      Pain Score 02/24/24 1340 8     Pain Loc --      Pain Education --      Exclude from Growth Chart --     No data found.  Updated Vital Signs BP 135/84 (BP Location: Left Arm)   Pulse 96   Temp 98.3 F (36.8 C) (Oral)   Resp 18   SpO2 96%   Visual Acuity Right Eye Distance:   Left Eye Distance:   Bilateral Distance:    Right Eye Near:   Left Eye Near:    Bilateral Near:     Physical Exam Constitutional:      Appearance: He is well-developed and normal weight.  Cardiovascular:     Rate and Rhythm: Normal rate and regular rhythm.     Heart sounds: Normal heart sounds.  Pulmonary:     Effort: Pulmonary effort is normal.     Breath sounds: Normal breath sounds.  Musculoskeletal:     Comments: +TTP to the right upper back near the scapula;  +TTP to the right upper chest wall;  pain with upward rotation of the right shoulder;   Skin:    General: Skin is warm.  Neurological:     General: No focal deficit present.     Mental Status: He is alert.      UC Treatments / Results  Labs (all labs ordered are listed, but only abnormal results are displayed) Labs Reviewed - No data to display  EKG   Radiology DG Chest 2 View Result Date: 02/24/2024 CLINICAL DATA:  Acute cough EXAM: CHEST - 2 VIEW COMPARISON:  11/21/2021 FINDINGS: The heart size and mediastinal contours are within normal limits. Both lungs are clear. The visualized skeletal structures are unremarkable. IMPRESSION: No active cardiopulmonary disease. Electronically Signed   By: Judie Petit.  Shick M.D.   On: 02/24/2024 14:33   DG Shoulder Right Result Date: 02/24/2024 CLINICAL DATA:  Right upper chest, shoulder and back pain for 2 weeks EXAM: RIGHT SHOULDER - 2+ VIEW COMPARISON:  02/24/2024 chest x-ray FINDINGS: Normal alignment without acute osseous finding, fracture, subluxation or dislocation. No significant arthropathy. AC joint also aligned without separation. Proximal right humerus ill-defined intramedullary sclerotic chondroid lesion noted with nonaggressive features favored to be enchondroma, roughly measuring 4.9 x 2.7  cm. Included right chest unremarkable. IMPRESSION: 1. No acute finding by plain radiography. 2. Proximal right humerus chondroid lesion  with nonaggressive features consistent with an enchondroma. These are benign lesions however, if the patient has associated shoulder pain consider further evaluation with MRI. Electronically Signed   By: Judie Petit.  Shick M.D.   On: 02/24/2024 14:31    Procedures Procedures (including critical care time)  Medications Ordered in UC Medications - No data to display  Initial Impression / Assessment and Plan / UC Course  I have reviewed the triage vital signs and the nursing notes.  Pertinent labs & imaging results that were available during my care of the patient were reviewed by me and considered in my medical decision making (see chart for details).  Spoke with patient about xray results over the phone.  He is aware of the results of the shoulder xray.  He is aware to follow up with his primary care provider for further imaging if needed.   Final Clinical Impressions(s) / UC Diagnoses   Final diagnoses:  Acute pain of right shoulder  Acute cough  Upper back pain on right side  Right-sided chest wall pain     Discharge Instructions      You were seen today for continued chest, shoulder and back pain.  Your xrays appears normal today, but if radiology reads them differently we will notify you.  I have sent out several other medications for you to take.  Please stop the cyclobenzaprine given previously.  I have sent out a different muscle relaxer that  may make you sleepy, but you will be more likely to use during the day for pain.  I recommend you pick up the diclofenac gel as prescribed previously.  Use a heating pad to the area as well.  If you continue with pain you should follow up with your primary care provider for possible referral to orthopedics or physical therapy.     ED Prescriptions     Medication Sig Dispense Auth. Provider   tiZANidine  (ZANAFLEX) 4 MG tablet Take 1 tablet (4 mg total) by mouth every 8 (eight) hours as needed. 30 tablet Jannifer Franklin, MD      PDMP not reviewed this encounter.   Jannifer Franklin, MD 02/24/24 1432    Jannifer Franklin, MD 02/24/24 1440
